# Patient Record
Sex: Male | Born: 1955 | Race: White | Hispanic: No | Marital: Married | Smoking: Former smoker
Health system: Southern US, Community
[De-identification: ages and names within clinical notes are randomized; demographics above are authoritative.]

## PROBLEM LIST (undated history)

## (undated) DIAGNOSIS — G4730 Sleep apnea, unspecified: Secondary | ICD-10-CM

## (undated) DIAGNOSIS — T7840XA Allergy, unspecified, initial encounter: Secondary | ICD-10-CM

## (undated) DIAGNOSIS — M1990 Unspecified osteoarthritis, unspecified site: Secondary | ICD-10-CM

## (undated) DIAGNOSIS — K219 Gastro-esophageal reflux disease without esophagitis: Secondary | ICD-10-CM

## (undated) DIAGNOSIS — T753XXA Motion sickness, initial encounter: Secondary | ICD-10-CM

## (undated) DIAGNOSIS — J45909 Unspecified asthma, uncomplicated: Secondary | ICD-10-CM

## (undated) HISTORY — DX: Unspecified osteoarthritis, unspecified site: M19.90

## (undated) HISTORY — DX: Unspecified asthma, uncomplicated: J45.909

## (undated) HISTORY — DX: Gastro-esophageal reflux disease without esophagitis: K21.9

## (undated) HISTORY — DX: Allergy, unspecified, initial encounter: T78.40XA

## (undated) HISTORY — PX: HERNIA REPAIR: SHX51

## (undated) HISTORY — DX: Sleep apnea, unspecified: G47.30

## (undated) HISTORY — PX: TONSILLECTOMY AND ADENOIDECTOMY: SUR1326

---

## 2006-09-21 ENCOUNTER — Ambulatory Visit: Payer: Self-pay | Admitting: Family Medicine

## 2006-11-22 ENCOUNTER — Ambulatory Visit: Payer: Self-pay | Admitting: Urology

## 2007-03-06 ENCOUNTER — Ambulatory Visit: Payer: Self-pay | Admitting: Gastroenterology

## 2010-07-28 ENCOUNTER — Emergency Department: Payer: Self-pay | Admitting: Emergency Medicine

## 2010-10-27 ENCOUNTER — Ambulatory Visit: Payer: Self-pay | Admitting: Family Medicine

## 2014-10-25 HISTORY — PX: COLONOSCOPY: SHX174

## 2015-04-16 ENCOUNTER — Other Ambulatory Visit: Payer: Self-pay | Admitting: Family Medicine

## 2015-04-16 DIAGNOSIS — F32A Depression, unspecified: Secondary | ICD-10-CM

## 2015-04-16 DIAGNOSIS — F329 Major depressive disorder, single episode, unspecified: Secondary | ICD-10-CM

## 2015-07-06 ENCOUNTER — Telehealth: Payer: Self-pay | Admitting: Gastroenterology

## 2015-07-07 NOTE — Telephone Encounter (Signed)
ERROR

## 2015-07-24 ENCOUNTER — Other Ambulatory Visit: Payer: Self-pay | Admitting: Family Medicine

## 2015-09-15 ENCOUNTER — Telehealth: Payer: Self-pay | Admitting: Gastroenterology

## 2015-09-15 ENCOUNTER — Other Ambulatory Visit: Payer: Self-pay

## 2015-09-15 NOTE — Telephone Encounter (Signed)
Pt has been rescheduled to Feb 20th at [facility] for a screening colonoscopy. Will you check and see if precert if required?

## 2015-09-15 NOTE — Telephone Encounter (Signed)
Patient left a voice message that he needs to reschedule his colonoscopy from 09/29/15 to sometime in February. Due to the snow exams at school are behind. Please call to reschedule

## 2015-09-16 NOTE — Telephone Encounter (Signed)
I do not see any insurance in chart. Is patient self pay?

## 2015-09-16 NOTE — Telephone Encounter (Signed)
Sorry, not sure why its not there.Contacted the pt and new insurance information has been put in system. He has [facility]. Pt now going to [facility] on 11/03/15.

## 2015-10-26 ENCOUNTER — Ambulatory Visit: Admit: 2015-10-26 | Payer: Self-pay | Admitting: Gastroenterology

## 2015-10-26 SURGERY — COLONOSCOPY WITH PROPOFOL
Anesthesia: Choice

## 2015-11-03 LAB — HM COLONOSCOPY

## 2015-11-10 ENCOUNTER — Encounter: Payer: Self-pay | Admitting: Gastroenterology

## 2015-11-26 ENCOUNTER — Other Ambulatory Visit: Payer: Self-pay

## 2015-12-10 ENCOUNTER — Ambulatory Visit
Admission: RE | Admit: 2015-12-10 | Discharge: 2015-12-10 | Disposition: A | Discharge status: Home / Self Care | Payer: BC Managed Care – PPO | Source: Ambulatory Visit | Attending: Family Medicine | Admitting: Family Medicine

## 2015-12-10 ENCOUNTER — Ambulatory Visit (INDEPENDENT_AMBULATORY_CARE_PROVIDER_SITE_OTHER): Payer: BC Managed Care – PPO | Admitting: Family Medicine

## 2015-12-10 ENCOUNTER — Encounter: Payer: Self-pay | Admitting: Family Medicine

## 2015-12-10 VITALS — BP 138/70 | HR 98 | Wt 205.0 lb

## 2015-12-10 DIAGNOSIS — J4522 Mild intermittent asthma with status asthmaticus: Secondary | ICD-10-CM

## 2015-12-10 DIAGNOSIS — R0602 Shortness of breath: Secondary | ICD-10-CM

## 2015-12-10 DIAGNOSIS — R079 Chest pain, unspecified: Secondary | ICD-10-CM

## 2015-12-10 DIAGNOSIS — J40 Bronchitis, not specified as acute or chronic: Secondary | ICD-10-CM | POA: Diagnosis not present

## 2015-12-10 MED ORDER — ALBUTEROL SULFATE (2.5 MG/3ML) 0.083% IN NEBU: 2.5000 mg | INHALATION_SOLUTION | Freq: Once | RESPIRATORY_TRACT | Status: AC

## 2015-12-10 MED ORDER — ALBUTEROL SULFATE HFA 108 (90 BASE) MCG/ACT IN AERS: 2.0000 | INHALATION_SPRAY | Freq: Four times a day (QID) | RESPIRATORY_TRACT | Status: DC | PRN

## 2015-12-10 MED ORDER — PREDNISONE 10 MG PO TABS: 10.0000 mg | ORAL_TABLET | Freq: Every day | ORAL | Status: DC

## 2015-12-10 MED ORDER — AMOXICILLIN-POT CLAVULANATE 875-125 MG PO TABS: 1.0000 | ORAL_TABLET | Freq: Two times a day (BID) | ORAL | Status: DC

## 2015-12-10 NOTE — Progress Notes (Signed)
Name: Eugene Kramer   MRN: [id number]    DOB: 1956/02/15   Date:12/10/2015       Progress Note  Subjective  Chief Complaint  Chief Complaint  Patient presents with  . Shortness of Breath    Shortness of Breath This is a new problem. The current episode started today. The problem occurs daily. The problem has been rapidly worsening. Associated symptoms include sputum production and wheezing. Pertinent negatives include no abdominal pain, chest pain, claudication, coryza, ear pain, fever, headaches, hemoptysis, leg pain, leg swelling, neck pain, orthopnea, PND, rash, rhinorrhea, sore throat, swollen glands, syncope or vomiting. The symptoms are aggravated by pollens and eating (had pound cake with strawberries). He has tried beta agonist inhalers (immediate response to nebulized albuterol) for the symptoms. The treatment provided mild relief. His past medical history is significant for asthma and bronchiolitis. There is no history of allergies.    No problem-specific assessment & plan notes found for this encounter.   No past medical history on file.  Past Surgical History  Procedure Laterality Date  . Hernia repair    . Tonsillectomy and adenoidectomy    . Colonoscopy  10/25/2014    Dr Allen Norris    No family history on file.  Social History   Social History  . Marital Status: Married    Spouse Name: N/A  . Number of Children: N/A  . Years of Education: N/A   Occupational History  . Not on file.   Social History Main Topics  . Smoking status: Former Research scientist (life sciences)  . Smokeless tobacco: Current User    Types: Snuff  . Alcohol Use: 0.0 oz/week    0 Standard drinks or equivalent per week  . Drug Use: No  . Sexual Activity: Not on file   Other Topics Concern  . Not on file   Social History Narrative  . No narrative on file    Allergies  Allergen Reactions  . Erythromycin      Review of Systems  Constitutional: Negative for fever, chills, weight loss and  malaise/fatigue.  HENT: Negative for ear discharge, ear pain, rhinorrhea and sore throat.   Eyes: Negative for blurred vision.  Respiratory: Positive for sputum production, shortness of breath and wheezing. Negative for cough and hemoptysis.   Cardiovascular: Negative for chest pain, palpitations, orthopnea, claudication, leg swelling, syncope and PND.  Gastrointestinal: Negative for heartburn, nausea, vomiting, abdominal pain, diarrhea, constipation, blood in stool and melena.  Genitourinary: Negative for dysuria, urgency, frequency and hematuria.  Musculoskeletal: Negative for myalgias, back pain, joint pain and neck pain.  Skin: Negative for rash.  Neurological: Negative for dizziness, tingling, sensory change, focal weakness and headaches.  Endo/Heme/Allergies: Negative for environmental allergies and polydipsia. Does not bruise/bleed easily.  Psychiatric/Behavioral: Negative for depression and suicidal ideas. The patient is not nervous/anxious and does not have insomnia.      Objective  Filed Vitals:   12/10/15 1348  BP: 138/70  Pulse: 98  Weight: 205 lb (92.987 kg)  SpO2: 99%    Physical Exam  Constitutional: He is oriented to person, place, and time and well-developed, well-nourished, and in no distress.  HENT:  Head: Normocephalic.  Right Ear: External ear normal.  Left Ear: External ear normal.  Nose: Nose normal.  Mouth/Throat: Oropharynx is clear and moist.  Eyes: Conjunctivae and EOM are normal. Pupils are equal, round, and reactive to light. Right eye exhibits no discharge. Left eye exhibits no discharge. No scleral icterus.  Neck: Normal range of motion.  Neck supple. No JVD present. No tracheal deviation present. No thyromegaly present.  Cardiovascular: Normal rate, regular rhythm, normal heart sounds and intact distal pulses.  Exam reveals no gallop and no friction rub.   No murmur heard. Pulmonary/Chest: He is in respiratory distress. He has wheezes. He has no  rales. He exhibits no tenderness.  Abdominal: Soft. Bowel sounds are normal. He exhibits no mass. There is no hepatosplenomegaly. There is no tenderness. There is no rebound, no guarding and no CVA tenderness.  Musculoskeletal: Normal range of motion. He exhibits no edema or tenderness.  Lymphadenopathy:    He has no cervical adenopathy.  Neurological: He is alert and oriented to person, place, and time. He has normal sensation, normal strength, normal reflexes and intact cranial nerves. No cranial nerve deficit.  Skin: Skin is warm. No rash noted.  Psychiatric: Mood and affect normal.  Nursing note and vitals reviewed.     Assessment & Plan  Problem List Items Addressed This Visit    None    Visit Diagnoses    Chest pain, unspecified chest pain type    -  Primary    Relevant Orders    EKG 12-Lead (Completed)    DG Chest 2 View    Asthma, mild intermittent, with status asthmaticus        breo sample/ pt refusea epipen    Relevant Medications    PROAIR HFA 108 (90 Base) MCG/ACT inhaler    predniSONE (DELTASONE) 10 MG tablet    albuterol (PROVENTIL HFA;VENTOLIN HFA) 108 (90 Base) MCG/ACT inhaler    albuterol (PROVENTIL) (2.5 MG/3ML) 0.083% nebulizer solution 2.5 mg    Other Relevant Orders    DG Chest 2 View    Bronchitis        Relevant Medications    amoxicillin-clavulanate (AUGMENTIN) 875-125 MG tablet         Dr. Guido [facility]  12/10/2015

## 2016-09-16 ENCOUNTER — Ambulatory Visit (INDEPENDENT_AMBULATORY_CARE_PROVIDER_SITE_OTHER): Payer: BC Managed Care – PPO | Admitting: Family Medicine

## 2016-09-16 ENCOUNTER — Encounter: Payer: Self-pay | Admitting: Family Medicine

## 2016-09-16 VITALS — BP 100/60 | HR 88 | Wt 206.0 lb

## 2016-09-16 DIAGNOSIS — J45901 Unspecified asthma with (acute) exacerbation: Secondary | ICD-10-CM | POA: Diagnosis not present

## 2016-09-16 DIAGNOSIS — J40 Bronchitis, not specified as acute or chronic: Secondary | ICD-10-CM

## 2016-09-16 DIAGNOSIS — R079 Chest pain, unspecified: Secondary | ICD-10-CM | POA: Diagnosis not present

## 2016-09-16 MED ORDER — PREDNISONE 10 MG PO TABS: 10.0000 mg | ORAL_TABLET | Freq: Every day | ORAL | 0 refills | Status: DC

## 2016-09-16 MED ORDER — IPRATROPIUM-ALBUTEROL 0.5-2.5 (3) MG/3ML IN SOLN: 3.0000 mL | Freq: Four times a day (QID) | RESPIRATORY_TRACT | 3 refills | Status: AC | PRN

## 2016-09-16 MED ORDER — AZITHROMYCIN 250 MG PO TABS: ORAL_TABLET | ORAL | 0 refills | Status: DC

## 2016-09-16 NOTE — Progress Notes (Signed)
Name: Eugene Kramer   MRN: [id number]    DOB: 1956/02/24   Date:09/16/2016       Progress Note  Subjective  Chief Complaint  Chief Complaint  Patient presents with  . Shortness of Breath    has a rescue inhaler- not helping    Shortness of Breath  This is a recurrent problem. The current episode started yesterday. The problem occurs constantly. The problem has been waxing and waning. Associated symptoms include chest pain, orthopnea, PND and wheezing. Pertinent negatives include no abdominal pain, claudication, coryza, ear pain, fever, headaches, hemoptysis, leg pain, leg swelling, neck pain, rash, rhinorrhea, sore throat, sputum production, swollen glands, syncope or vomiting. Nothing aggravates the symptoms. The patient has no known risk factors for DVT/PE. He has tried beta agonist inhalers for the symptoms. The treatment provided mild relief. His past medical history is significant for asthma and pneumonia. There is no history of allergies, aspirin allergies, bronchiolitis, CAD, chronic lung disease, COPD, DVT, a heart failure, PE or a recent surgery.  Chest Pain   This is a new problem. The current episode started yesterday. The problem has been waxing and waning. The quality of the pain is described as tightness. The pain does not radiate. Associated symptoms include orthopnea, PND and shortness of breath. Pertinent negatives include no abdominal pain, back pain, claudication, cough, dizziness, fever, headaches, hemoptysis, leg pain, malaise/fatigue, nausea, palpitations, sputum production, syncope or vomiting.  Pertinent negatives for past medical history include no CAD, no DVT and no PE.    No problem-specific Assessment & Plan notes found for this encounter.   Past Medical History:  Diagnosis Date  . Asthma     Past Surgical History:  Procedure Laterality Date  . COLONOSCOPY  10/25/2014   Dr Allen Norris  . HERNIA REPAIR    . TONSILLECTOMY AND ADENOIDECTOMY      History  reviewed. No pertinent family history.  Social History   Social History  . Marital status: Married    Spouse name: N/A  . Number of children: N/A  . Years of education: N/A   Occupational History  . Not on file.   Social History Main Topics  . Smoking status: Former Research scientist (life sciences)  . Smokeless tobacco: Current User    Types: Snuff  . Alcohol use 0.0 oz/week  . Drug use: No  . Sexual activity: Yes   Other Topics Concern  . Not on file   Social History Narrative  . No narrative on file    Allergies  Allergen Reactions  . Erythromycin      Review of Systems  Constitutional: Negative for chills, fever, malaise/fatigue and weight loss.  HENT: Negative for ear discharge, ear pain, rhinorrhea and sore throat.   Eyes: Negative for blurred vision.  Respiratory: Positive for shortness of breath and wheezing. Negative for cough, hemoptysis and sputum production.   Cardiovascular: Positive for chest pain, orthopnea and PND. Negative for palpitations, claudication, leg swelling and syncope.  Gastrointestinal: Negative for abdominal pain, blood in stool, constipation, diarrhea, heartburn, melena, nausea and vomiting.  Genitourinary: Negative for dysuria, frequency, hematuria and urgency.  Musculoskeletal: Negative for back pain, joint pain, myalgias and neck pain.  Skin: Negative for rash.  Neurological: Negative for dizziness, tingling, sensory change, focal weakness and headaches.  Endo/Heme/Allergies: Negative for environmental allergies and polydipsia. Does not bruise/bleed easily.  Psychiatric/Behavioral: Negative for depression and suicidal ideas. The patient is not nervous/anxious and does not have insomnia.      Objective  Vitals:  09/16/16 0833  BP: 100/60  Pulse: 88  SpO2: 98%  Weight: 206 lb (93.4 kg)    Physical Exam  Constitutional: He is oriented to person, place, and time and well-developed, well-nourished, and in no distress.  HENT:  Head: Normocephalic.   Right Ear: External ear normal.  Left Ear: External ear normal.  Nose: Nose normal.  Mouth/Throat: Oropharynx is clear and moist.  Eyes: Conjunctivae and EOM are normal. Pupils are equal, round, and reactive to light. Right eye exhibits no discharge. Left eye exhibits no discharge. No scleral icterus.  Neck: Normal range of motion. Neck supple. No JVD present. No tracheal deviation present. No thyromegaly present.  Cardiovascular: Normal rate, regular rhythm, normal heart sounds and intact distal pulses.  Exam reveals no gallop and no friction rub.   No murmur heard. Pulmonary/Chest: Effort normal. No respiratory distress. He has wheezes. He has no rales. He exhibits no tenderness.  Abdominal: Soft. Bowel sounds are normal. He exhibits no distension and no mass. There is no hepatosplenomegaly. There is no tenderness. There is no rebound, no guarding and no CVA tenderness.  Musculoskeletal: Normal range of motion. He exhibits no edema or tenderness.  Lymphadenopathy:    He has no cervical adenopathy.  Neurological: He is alert and oriented to person, place, and time. He has normal sensation, normal strength, normal reflexes and intact cranial nerves. No cranial nerve deficit.  Skin: Skin is warm. No rash noted.  Psychiatric: Mood and affect normal.  Nursing note and vitals reviewed.     Assessment & Plan  Problem List Items Addressed This Visit    None    Visit Diagnoses    Moderate asthma with acute exacerbation, unspecified whether persistent    -  Primary   spacer/ nebulizer with tubing/pt to inquire about [facility] pulmonologist   Relevant Medications   ipratropium-albuterol (DUONEB) 0.5-2.5 (3) MG/3ML SOLN   predniSONE (DELTASONE) 10 MG tablet   Other Relevant Orders   Ambulatory referral to Pulmonology   Chest pain, unspecified type       Relevant Orders   EKG 12-Lead (Completed)   Bronchitis       Relevant Orders   Ambulatory referral to Pulmonology        Dr. Otilio Miu [facility]  09/16/16

## 2016-09-28 ENCOUNTER — Ambulatory Visit (INDEPENDENT_AMBULATORY_CARE_PROVIDER_SITE_OTHER): Payer: BC Managed Care – PPO | Admitting: Family Medicine

## 2016-09-28 ENCOUNTER — Encounter: Payer: Self-pay | Admitting: Family Medicine

## 2016-09-28 ENCOUNTER — Other Ambulatory Visit: Payer: Self-pay

## 2016-09-28 VITALS — BP 130/70 | HR 80 | Ht 72.0 in | Wt 207.0 lb

## 2016-09-28 DIAGNOSIS — N410 Acute prostatitis: Secondary | ICD-10-CM

## 2016-09-28 DIAGNOSIS — J40 Bronchitis, not specified as acute or chronic: Secondary | ICD-10-CM | POA: Diagnosis not present

## 2016-09-28 LAB — POCT URINALYSIS DIPSTICK
Bilirubin, UA: NEGATIVE
Blood, UA: NEGATIVE
Glucose, UA: NEGATIVE
KETONES UA: NEGATIVE
Leukocytes, UA: NEGATIVE
Nitrite, UA: NEGATIVE
PH UA: 5
PROTEIN UA: NEGATIVE
SPEC GRAV UA: 1.01
Urobilinogen, UA: 0.2

## 2016-09-28 MED ORDER — LEVOFLOXACIN 500 MG PO TABS: 500.0000 mg | ORAL_TABLET | Freq: Every day | ORAL | 0 refills | Status: DC

## 2016-09-28 NOTE — Progress Notes (Signed)
Name: Eugene Kramer   MRN: [id number]    DOB: 13-Dec-1955   Date:09/28/2016       Progress Note  Subjective  Chief Complaint  Chief Complaint  Patient presents with  . Bronchitis    has started with fever, chills, hip aching, sweating, coughing up yellow production  . inhaler    has one more day of Breo sample- do you want him to stay on it?    Cough  This is a chronic problem. The problem has been waxing and waning. The cough is productive of purulent sputum. Associated symptoms include chills, a fever, nasal congestion, postnasal drip, rhinorrhea and a sore throat. Pertinent negatives include no chest pain, ear pain, headaches, heartburn, hemoptysis, myalgias, rash, shortness of breath, weight loss or wheezing. He has tried a beta-agonist inhaler for the symptoms. The treatment provided mild relief. There is no history of environmental allergies.    No problem-specific Assessment & Plan notes found for this encounter.   Past Medical History:  Diagnosis Date  . Asthma     Past Surgical History:  Procedure Laterality Date  . COLONOSCOPY  10/25/2014   Dr Allen Norris  . HERNIA REPAIR    . TONSILLECTOMY AND ADENOIDECTOMY      No family history on file.  Social History   Social History  . Marital status: Married    Spouse name: N/A  . Number of children: N/A  . Years of education: N/A   Occupational History  . Not on file.   Social History Main Topics  . Smoking status: Former Research scientist (life sciences)  . Smokeless tobacco: Current User    Types: Snuff  . Alcohol use 0.0 oz/week  . Drug use: No  . Sexual activity: Yes   Other Topics Concern  . Not on file   Social History Narrative  . No narrative on file    Allergies  Allergen Reactions  . Erythromycin      Review of Systems  Constitutional: Positive for chills and fever. Negative for malaise/fatigue and weight loss.  HENT: Positive for postnasal drip, rhinorrhea and sore throat. Negative for ear discharge and ear pain.    Eyes: Negative for blurred vision.  Respiratory: Positive for cough. Negative for hemoptysis, sputum production, shortness of breath and wheezing.   Cardiovascular: Negative for chest pain, palpitations and leg swelling.  Gastrointestinal: Negative for abdominal pain, blood in stool, constipation, diarrhea, heartburn, melena and nausea.  Genitourinary: Negative for dysuria, frequency, hematuria and urgency.  Musculoskeletal: Negative for back pain, joint pain, myalgias and neck pain.  Skin: Negative for rash.  Neurological: Negative for dizziness, tingling, sensory change, focal weakness and headaches.  Endo/Heme/Allergies: Negative for environmental allergies and polydipsia. Does not bruise/bleed easily.  Psychiatric/Behavioral: Negative for depression and suicidal ideas. The patient is not nervous/anxious and does not have insomnia.      Objective  Vitals:   09/28/16 1217  BP: 130/70  Pulse: 80  Weight: 207 lb (93.9 kg)  Height: 6' (1.829 m)    Physical Exam  Constitutional: He is oriented to person, place, and time and well-developed, well-nourished, and in no distress.  HENT:  Head: Normocephalic.  Right Ear: External ear normal.  Left Ear: External ear normal.  Nose: Nose normal.  Mouth/Throat: Oropharynx is clear and moist.  Eyes: Conjunctivae and EOM are normal. Pupils are equal, round, and reactive to light. Right eye exhibits no discharge. Left eye exhibits no discharge. No scleral icterus.  Neck: Normal range of motion. Neck supple. No  JVD present. No tracheal deviation present. No thyromegaly present.  Cardiovascular: Normal rate, regular rhythm, normal heart sounds and intact distal pulses.  Exam reveals no gallop and no friction rub.   No murmur heard. Pulmonary/Chest: Breath sounds normal. No respiratory distress. He has no wheezes. He has no rales.  Abdominal: Soft. Bowel sounds are normal. He exhibits no mass. There is no hepatosplenomegaly. There is no  tenderness. There is no rebound, no guarding and no CVA tenderness.  Musculoskeletal: Normal range of motion. He exhibits no edema or tenderness.  Lymphadenopathy:    He has no cervical adenopathy.  Neurological: He is alert and oriented to person, place, and time. He has normal sensation, normal strength, normal reflexes and intact cranial nerves. No cranial nerve deficit.  Skin: Skin is warm. No rash noted.  Psychiatric: Mood and affect normal.  Nursing note and vitals reviewed.     Assessment & Plan  Problem List Items Addressed This Visit    None    Visit Diagnoses    Bronchitis    -  Primary   Breo sample given   Relevant Medications   levofloxacin (LEVAQUIN) 500 MG tablet   Acute prostatitis       Relevant Medications   levofloxacin (LEVAQUIN) 500 MG tablet        Dr. [facility]  09/28/16

## 2017-04-06 ENCOUNTER — Encounter: Payer: Self-pay | Admitting: Family Medicine

## 2017-04-06 ENCOUNTER — Ambulatory Visit (INDEPENDENT_AMBULATORY_CARE_PROVIDER_SITE_OTHER): Payer: BC Managed Care – PPO | Admitting: Family Medicine

## 2017-04-06 ENCOUNTER — Encounter: Payer: BC Managed Care – PPO | Admitting: Family Medicine

## 2017-04-06 VITALS — BP 120/72 | HR 78 | Ht 72.0 in | Wt 206.0 lb

## 2017-04-06 DIAGNOSIS — R351 Nocturia: Secondary | ICD-10-CM | POA: Diagnosis not present

## 2017-04-06 DIAGNOSIS — Z0000 Encounter for general adult medical examination without abnormal findings: Secondary | ICD-10-CM

## 2017-04-06 MED ORDER — ALBUTEROL SULFATE HFA 108 (90 BASE) MCG/ACT IN AERS: 2.0000 | INHALATION_SPRAY | Freq: Four times a day (QID) | RESPIRATORY_TRACT | 11 refills | Status: DC | PRN

## 2017-04-06 MED ORDER — FLUTICASONE PROPIONATE 50 MCG/ACT NA SUSP: 1.0000 | Freq: Every day | NASAL | 11 refills | Status: DC

## 2017-04-06 NOTE — Progress Notes (Signed)
Name: Eugene Kramer   MRN: [id number]    DOB: [birth year]   Date:04/06/2017       Progress Note  Subjective  Chief Complaint  Chief Complaint  Patient presents with  . Annual Exam  . Allergic Rhinitis     Patient presents for annual physical exam.   Sinus Problem  This is a chronic problem. The current episode started more than 1 year ago. The problem has been waxing and waning since onset. There has been no fever. The pain is mild. Associated symptoms include congestion and sinus pressure. Pertinent negatives include no chills, coughing, diaphoresis, ear pain, headaches, hoarse voice, neck pain, shortness of breath, sneezing, sore throat or swollen glands. Treatments tried: antihistamine. The treatment provided mild relief.    No problem-specific Assessment & Plan notes found for this encounter.   Past Medical History:  Diagnosis Date  . Asthma     Past Surgical History:  Procedure Laterality Date  . COLONOSCOPY  10/25/2014   Dr Allen Norris  . HERNIA REPAIR    . TONSILLECTOMY AND ADENOIDECTOMY      No family history on file.  Social History   Social History  . Marital status: Married    Spouse name: N/A  . Number of children: N/A  . Years of education: N/A   Occupational History  . Not on file.   Social History Main Topics  . Smoking status: Former Research scientist (life sciences)  . Smokeless tobacco: Current User    Types: Snuff  . Alcohol use 0.0 oz/week  . Drug use: No  . Sexual activity: Yes   Other Topics Concern  . Not on file   Social History Narrative  . No narrative on file    Allergies  Allergen Reactions  . Erythromycin     Outpatient Medications Prior to Visit  Medication Sig Dispense Refill  . APPLE CIDER VINEGAR PO Take 1 ampule by mouth daily.    Marland Kitchen aspirin 81 MG tablet Take 81 mg by mouth daily.    . Cinnamon 500 MG TABS Take 1 tablet by mouth daily.    . Garlic 629 MG CAPS Take 1 capsule by mouth daily.    Marland Kitchen ipratropium-albuterol (DUONEB) 0.5-2.5 (3)  MG/3ML SOLN Take 3 mLs by nebulization every 6 (six) hours as needed. 360 mL 3  . Misc Natural Products (PROSTATE HEALTH) CAPS Take 1 capsule by mouth daily.    . Omega-3 Fatty Acids (FISH OIL) 1000 MG CAPS Take 1 capsule by mouth daily.    Marland Kitchen albuterol (PROVENTIL HFA;VENTOLIN HFA) 108 (90 Base) MCG/ACT inhaler Inhale 2 puffs into the lungs every 6 (six) hours as needed for wheezing or shortness of breath. 1 Inhaler 6  . fluticasone (FLONASE) 50 MCG/ACT nasal spray     . levofloxacin (LEVAQUIN) 500 MG tablet Take 1 tablet (500 mg total) by mouth daily. 14 tablet 0  . predniSONE (DELTASONE) 10 MG tablet Take 1 tablet (10 mg total) by mouth daily with breakfast. 30 tablet 0   Facility-Administered Medications Prior to Visit  Medication Dose Route Frequency Provider Last Rate Last Dose  . albuterol (PROVENTIL) (2.5 MG/3ML) 0.083% nebulizer solution 2.5 mg  2.5 mg Nebulization Once Juline Patch, MD        Review of Systems  Constitutional: Negative for chills, diaphoresis, fever, malaise/fatigue and weight loss.  HENT: Positive for congestion, hearing loss, sinus pain and sinus pressure. Negative for ear discharge, ear pain, hoarse voice, sneezing and sore throat.   Eyes: Negative for  blurred vision.  Respiratory: Negative for cough, sputum production, shortness of breath and wheezing.   Cardiovascular: Negative for chest pain, palpitations and leg swelling.  Gastrointestinal: Negative for abdominal pain, blood in stool, constipation, diarrhea, heartburn, melena and nausea.  Genitourinary: Negative for dysuria, frequency, hematuria and urgency.       Nocturia  Musculoskeletal: Negative for back pain, joint pain, myalgias and neck pain.  Skin: Negative for rash.  Neurological: Negative for dizziness, tingling, sensory change, focal weakness and headaches.  Endo/Heme/Allergies: Negative for environmental allergies and polydipsia. Does not bruise/bleed easily.  Psychiatric/Behavioral: Negative  for depression and suicidal ideas. The patient is not nervous/anxious and does not have insomnia.      Objective  Vitals:   04/06/17 0940  BP: 120/72  Pulse: 78  Weight: 206 lb (93.4 kg)  Height: 6' (1.829 m)    Physical Exam  Constitutional: He is oriented to person, place, and time and well-developed, well-nourished, and in no distress.  HENT:  Head: Normocephalic.  Right Ear: Tympanic membrane, external ear and ear canal normal.  Left Ear: Tympanic membrane, external ear and ear canal normal.  Nose: Nose normal.  Mouth/Throat: Uvula is midline, oropharynx is clear and moist and mucous membranes are normal.  Eyes: Pupils are equal, round, and reactive to light. Conjunctivae and EOM are normal. Right eye exhibits no discharge. Left eye exhibits no discharge. No scleral icterus.  Fundoscopic exam:      The right eye shows no arteriolar narrowing and no AV nicking.       The left eye shows no arteriolar narrowing and no AV nicking.  Neck: Trachea normal, normal range of motion and phonation normal. Neck supple. Normal carotid pulses, no hepatojugular reflux and no JVD present. No tracheal tenderness present. Carotid bruit is not present. No tracheal deviation present. No thyroid mass and no thyromegaly present.  Cardiovascular: Normal rate, regular rhythm, S1 normal, S2 normal, normal heart sounds, intact distal pulses and normal pulses.  PMI is not displaced.  Exam reveals no gallop, no S3, no S4 and no friction rub.   No murmur heard. Pulmonary/Chest: Breath sounds normal. No stridor. No respiratory distress. He has no wheezes. He has no rales. Right breast exhibits no mass. Left breast exhibits no mass.  Abdominal: Soft. Normal aorta and bowel sounds are normal. He exhibits no mass. There is no hepatosplenomegaly. There is no tenderness. There is no rebound, no guarding and no CVA tenderness.  Genitourinary: Testes/scrotum normal.  Musculoskeletal: Normal range of motion. He  exhibits no edema or tenderness.  Lymphadenopathy:       Head (right side): No submental and no submandibular adenopathy present.       Head (left side): No submental and no submandibular adenopathy present.    He has no cervical adenopathy.  Neurological: He is alert and oriented to person, place, and time. He has normal sensation, normal strength, normal reflexes and intact cranial nerves. No cranial nerve deficit.  Skin: Skin is warm and intact. No rash noted.  Psychiatric: Mood and affect normal.  Nursing note and vitals reviewed.     Assessment & Plan  Problem List Items Addressed This Visit    None    Visit Diagnoses    Annual physical exam    -  Primary   Relevant Orders   Lipid Profile   Renal function panel   PSA   Nocturia       Relevant Orders   Renal function panel   PSA  Meds ordered this encounter  Medications  . albuterol (PROVENTIL HFA;VENTOLIN HFA) 108 (90 Base) MCG/ACT inhaler    Sig: Inhale 2 puffs into the lungs every 6 (six) hours as needed for wheezing or shortness of breath.    Dispense:  1 Inhaler    Refill:  11  . fluticasone (FLONASE) 50 MCG/ACT nasal spray    Sig: Place 1 spray into both nostrils daily.    Dispense:  16 g    Refill:  11      Dr. [facility]  04/06/17

## 2017-04-07 LAB — LIPID PANEL
CHOLESTEROL TOTAL: 193 mg/dL (ref 100–199)
Chol/HDL Ratio: 4.8 ratio (ref 0.0–5.0)
HDL: 40 mg/dL (ref >39)
LDL Calculated: 124 mg/dL — ABNORMAL HIGH (ref 0–99)
Triglycerides: 147 mg/dL (ref 0–149)
VLDL CHOLESTEROL CAL: 29 mg/dL (ref 5–40)

## 2017-04-07 LAB — RENAL FUNCTION PANEL
ALBUMIN: 4.6 g/dL (ref 3.5–5.5)
BUN/Creatinine Ratio: 12 (ref 9–20)
BUN: 11 mg/dL (ref 6–24)
CHLORIDE: 102 mmol/L (ref 96–106)
CO2: 25 mmol/L (ref 20–29)
Calcium: 9.6 mg/dL (ref 8.7–10.2)
Creatinine, Ser: 0.95 mg/dL (ref 0.76–1.27)
GFR calc non Af Amer: 87 mL/min/{1.73_m2} (ref >59)
GFR, EST AFRICAN AMERICAN: 101 mL/min/{1.73_m2} (ref >59)
Glucose: 78 mg/dL (ref 65–99)
Phosphorus: 3.1 mg/dL (ref 2.5–4.5)
Potassium: 4.3 mmol/L (ref 3.5–5.2)
Sodium: 143 mmol/L (ref 134–144)

## 2017-04-07 LAB — PSA: Prostate Specific Ag, Serum: 0.8 ng/mL (ref 0.0–4.0)

## 2017-04-10 ENCOUNTER — Other Ambulatory Visit: Payer: Self-pay

## 2017-06-02 ENCOUNTER — Other Ambulatory Visit: Payer: Self-pay

## 2017-06-26 ENCOUNTER — Encounter: Payer: Self-pay | Admitting: Family Medicine

## 2017-06-26 ENCOUNTER — Ambulatory Visit
Admission: RE | Admit: 2017-06-26 | Discharge: 2017-06-26 | Disposition: A | Discharge status: Home / Self Care | Payer: BC Managed Care – PPO | Source: Ambulatory Visit | Attending: Family Medicine | Admitting: Family Medicine

## 2017-06-26 ENCOUNTER — Other Ambulatory Visit: Payer: Self-pay | Admitting: Family Medicine

## 2017-06-26 ENCOUNTER — Ambulatory Visit (INDEPENDENT_AMBULATORY_CARE_PROVIDER_SITE_OTHER): Payer: BC Managed Care – PPO | Admitting: Family Medicine

## 2017-06-26 VITALS — BP 110/78 | HR 88 | Ht 72.0 in | Wt 212.0 lb

## 2017-06-26 DIAGNOSIS — J4521 Mild intermittent asthma with (acute) exacerbation: Secondary | ICD-10-CM

## 2017-06-26 DIAGNOSIS — J40 Bronchitis, not specified as acute or chronic: Secondary | ICD-10-CM

## 2017-06-26 DIAGNOSIS — R918 Other nonspecific abnormal finding of lung field: Secondary | ICD-10-CM | POA: Insufficient documentation

## 2017-06-26 DIAGNOSIS — J45909 Unspecified asthma, uncomplicated: Secondary | ICD-10-CM | POA: Insufficient documentation

## 2017-06-26 DIAGNOSIS — R05 Cough: Secondary | ICD-10-CM | POA: Diagnosis not present

## 2017-06-26 MED ORDER — PREDNISONE 10 MG (21) PO TBPK: ORAL_TABLET | Freq: Every day | ORAL | 0 refills | Status: DC

## 2017-06-26 MED ORDER — AMOXICILLIN-POT CLAVULANATE 875-125 MG PO TABS: 1.0000 | ORAL_TABLET | Freq: Two times a day (BID) | ORAL | 0 refills | Status: DC

## 2017-06-26 NOTE — Progress Notes (Signed)
Name: Eugene Kramer   MRN: [id number]    DOB: [birth year]   Date:06/26/2017       Progress Note  Subjective  Chief Complaint  Chief Complaint  Patient presents with  . Asthma    has been having to take ventolin more than prescribed. Has been mowing and tilling. Tad prednisone at home- took 30mg  on Sat and 30mg  yesterday    Asthma  He complains of cough, difficulty breathing, hoarse voice, shortness of breath and wheezing. There is no chest tightness, frequent throat clearing, hemoptysis or sputum production. This is a recurrent problem. The current episode started in the past 7 days. The problem occurs intermittently. The problem has been waxing and waning. The cough is productive of purulent sputum. Associated symptoms include dyspnea on exertion, nasal congestion, rhinorrhea and sneezing. Pertinent negatives include no appetite change, chest pain, ear congestion, ear pain, fever, headaches, heartburn, malaise/fatigue, myalgias, orthopnea, PND, postnasal drip, sore throat, sweats, trouble swallowing or weight loss. His symptoms are aggravated by change in weather and occupational exposure. His symptoms are alleviated by beta-agonist and oral steroids. He reports moderate improvement on treatment. Risk factors for lung disease include occupational exposure. His past medical history is significant for asthma.    No problem-specific Assessment & Plan notes found for this encounter.   Past Medical History:  Diagnosis Date  . Asthma     Past Surgical History:  Procedure Laterality Date  . COLONOSCOPY  10/25/2014   Dr Allen Norris  . HERNIA REPAIR    . TONSILLECTOMY AND ADENOIDECTOMY      No family history on file.  Social History   Social History  . Marital status: Married    Spouse name: N/A  . Number of children: N/A  . Years of education: N/A   Occupational History  . Not on file.   Social History Main Topics  . Smoking status: Former Research scientist (life sciences)  . Smokeless tobacco: Current  User    Types: Snuff  . Alcohol use 0.0 oz/week  . Drug use: No  . Sexual activity: Yes   Other Topics Concern  . Not on file   Social History Narrative  . No narrative on file    Allergies  Allergen Reactions  . Erythromycin     Outpatient Medications Prior to Visit  Medication Sig Dispense Refill  . albuterol (PROVENTIL HFA;VENTOLIN HFA) 108 (90 Base) MCG/ACT inhaler Inhale 2 puffs into the lungs every 6 (six) hours as needed for wheezing or shortness of breath. 1 Inhaler 11  . APPLE CIDER VINEGAR PO Take 1 ampule by mouth daily.    Marland Kitchen aspirin 81 MG tablet Take 81 mg by mouth daily.    . Cinnamon 500 MG TABS Take 1 tablet by mouth daily.    . fluticasone (FLONASE) 50 MCG/ACT nasal spray Place 1 spray into both nostrils daily. 16 g 11  . Garlic 893 MG CAPS Take 1 capsule by mouth daily.    Marland Kitchen ipratropium-albuterol (DUONEB) 0.5-2.5 (3) MG/3ML SOLN Take 3 mLs by nebulization every 6 (six) hours as needed. 360 mL 3  . Misc Natural Products (PROSTATE HEALTH) CAPS Take 1 capsule by mouth daily.    . Omega-3 Fatty Acids (FISH OIL) 1000 MG CAPS Take 1 capsule by mouth daily.     Facility-Administered Medications Prior to Visit  Medication Dose Route Frequency Provider Last Rate Last Dose  . albuterol (PROVENTIL) (2.5 MG/3ML) 0.083% nebulizer solution 2.5 mg  2.5 mg Nebulization Once Juline Patch, MD  Review of Systems  Constitutional: Negative for appetite change, chills, fever, malaise/fatigue and weight loss.  HENT: Positive for hoarse voice, rhinorrhea and sneezing. Negative for ear discharge, ear pain, postnasal drip, sore throat and trouble swallowing.   Eyes: Negative for blurred vision.  Respiratory: Positive for cough, shortness of breath and wheezing. Negative for hemoptysis and sputum production.   Cardiovascular: Positive for dyspnea on exertion. Negative for chest pain, palpitations, leg swelling and PND.  Gastrointestinal: Negative for abdominal pain, blood in  stool, constipation, diarrhea, heartburn, melena and nausea.  Genitourinary: Negative for dysuria, frequency, hematuria and urgency.  Musculoskeletal: Negative for back pain, joint pain, myalgias and neck pain.  Skin: Negative for rash.  Neurological: Negative for dizziness, tingling, sensory change, focal weakness and headaches.  Endo/Heme/Allergies: Negative for environmental allergies and polydipsia. Does not bruise/bleed easily.  Psychiatric/Behavioral: Negative for depression and suicidal ideas. The patient is not nervous/anxious and does not have insomnia.      Objective  Vitals:   06/26/17 1407  BP: 110/78  Pulse: 88  SpO2: 97%  Weight: 212 lb (96.2 kg)  Height: 6' (1.829 m)    Physical Exam  Constitutional: He is oriented to person, place, and time and well-developed, well-nourished, and in no distress.  HENT:  Head: Normocephalic.  Right Ear: External ear normal.  Left Ear: External ear normal.  Nose: Nose normal.  Mouth/Throat: Oropharynx is clear and moist.  Eyes: Pupils are equal, round, and reactive to light. Conjunctivae and EOM are normal. Right eye exhibits no discharge. Left eye exhibits no discharge. No scleral icterus.  Neck: Normal range of motion. Neck supple. No JVD present. No tracheal deviation present. No thyromegaly present.  Cardiovascular: Normal rate, regular rhythm, normal heart sounds and intact distal pulses.  Exam reveals no gallop and no friction rub.   No murmur heard. Pulmonary/Chest: No respiratory distress. He has wheezes. He has no rales.  Abdominal: Soft. Bowel sounds are normal. He exhibits no mass. There is no hepatosplenomegaly. There is no tenderness. There is no rebound, no guarding and no CVA tenderness.  Musculoskeletal: Normal range of motion. He exhibits no edema or tenderness.  Lymphadenopathy:    He has no cervical adenopathy.  Neurological: He is alert and oriented to person, place, and time. He has normal sensation, normal  strength, normal reflexes and intact cranial nerves. No cranial nerve deficit.  Skin: Skin is warm. No rash noted.  Psychiatric: Mood and affect normal.  Nursing note and vitals reviewed.     Assessment & Plan  Problem List Items Addressed This Visit    None      No orders of the defined types were placed in this encounter.     Dr. [facility]  06/26/17

## 2017-06-26 NOTE — Patient Instructions (Signed)

## 2017-07-20 ENCOUNTER — Other Ambulatory Visit: Payer: Self-pay

## 2017-08-05 IMAGING — CR DG CHEST 2V
3 series · 3 of 3 positions shown · non-contrast
Comparison: 09/21/2006

CLINICAL DATA: Chest pain.  Wheezing.  Ex-smoker.

EXAM:
CHEST  2 VIEW

[chest pa (1 of 2)]
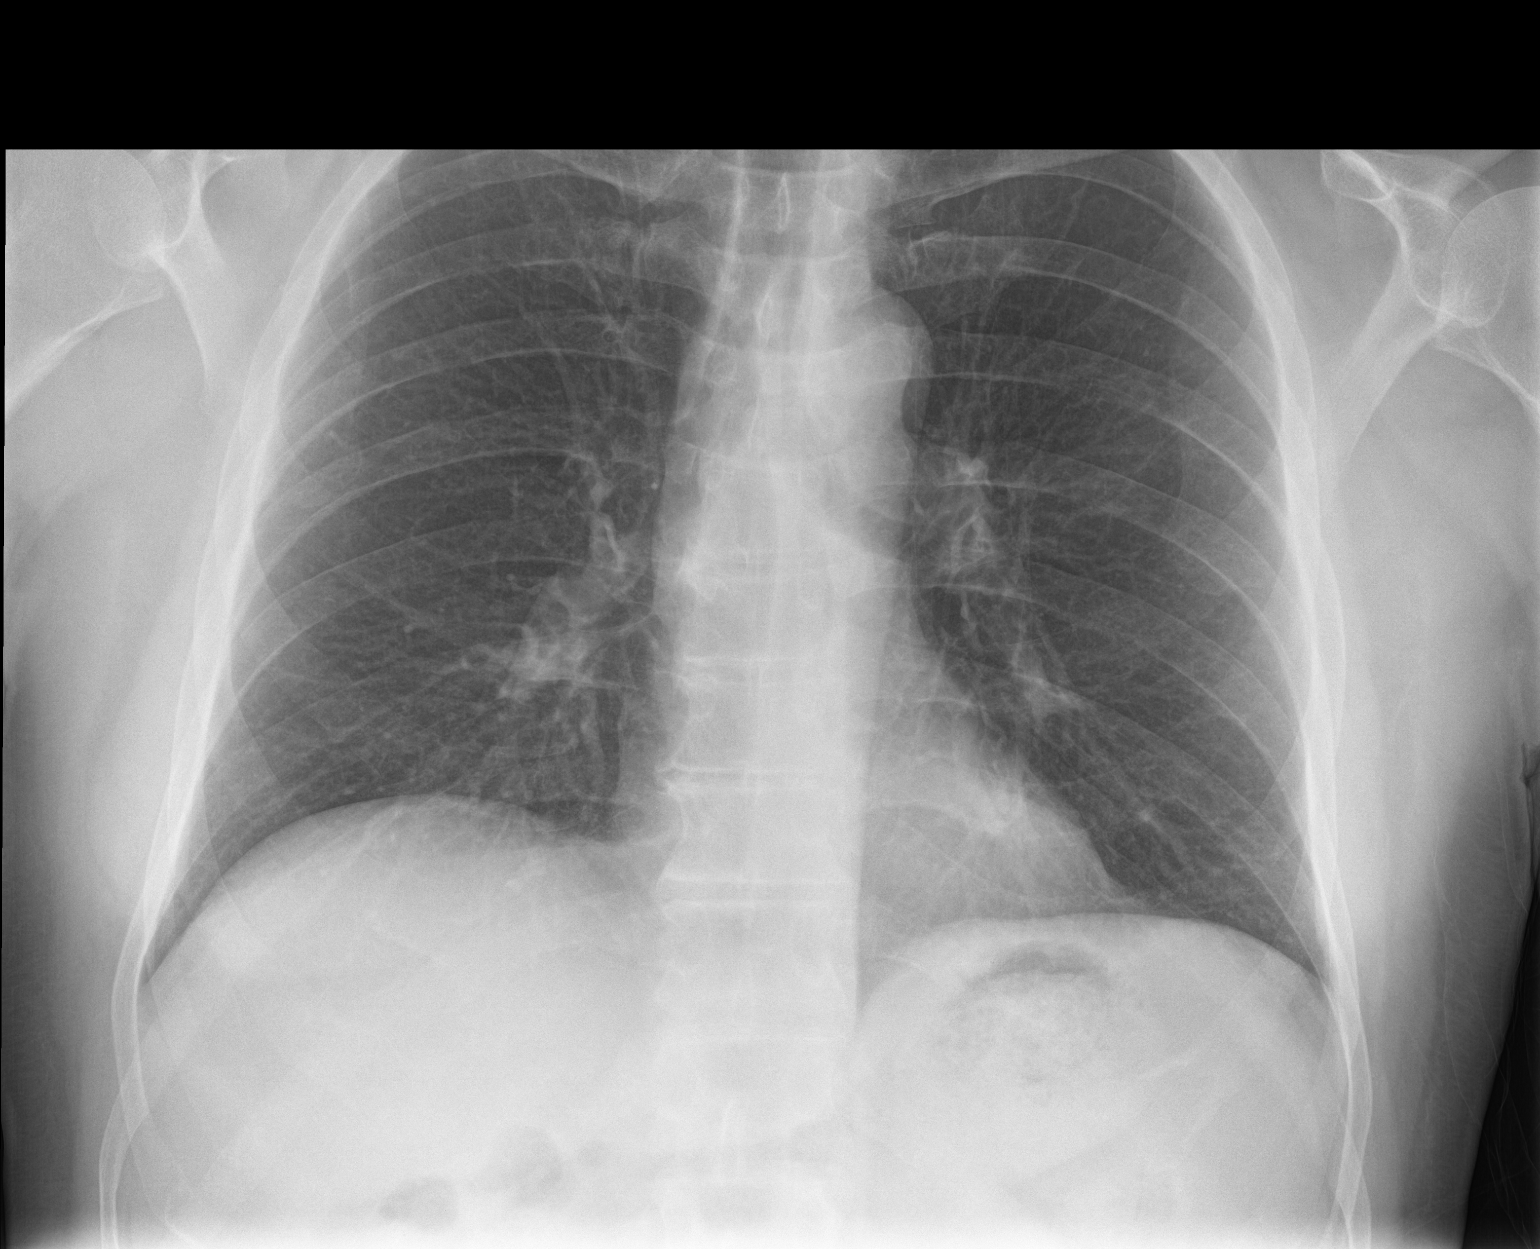

[chest lat]
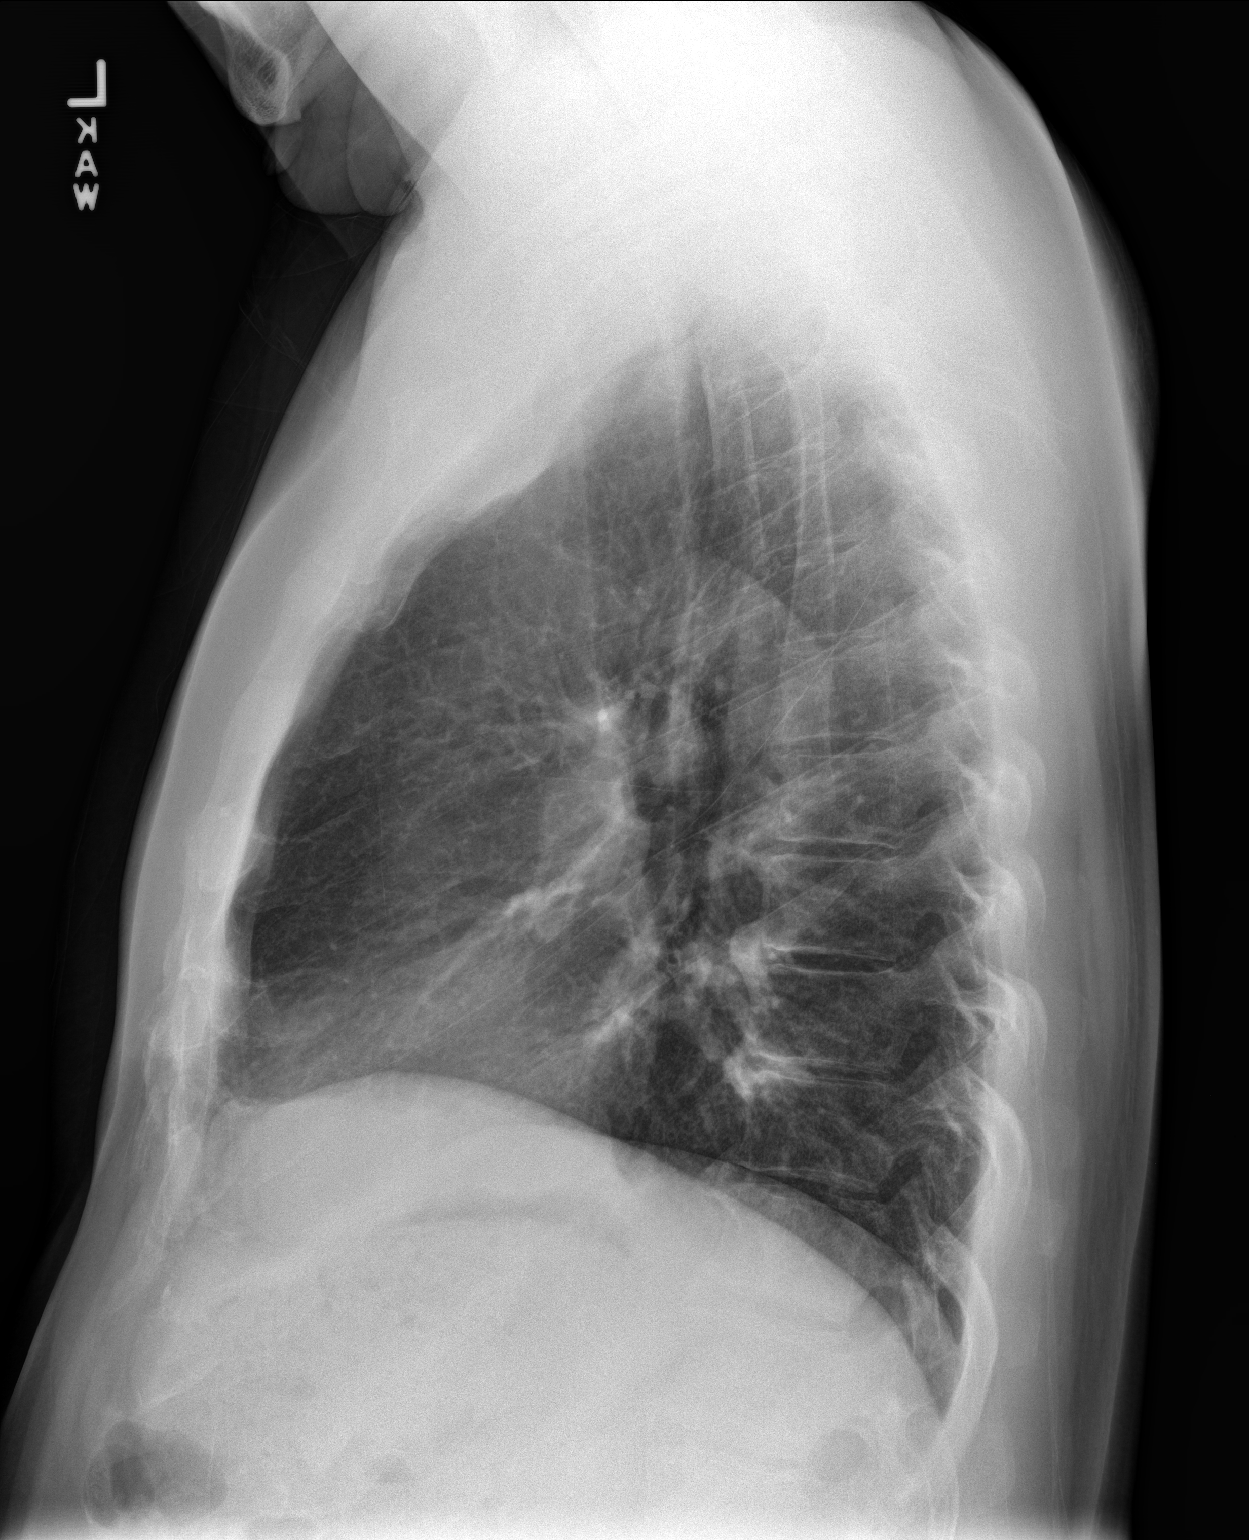

[chest pa (2 of 2)]
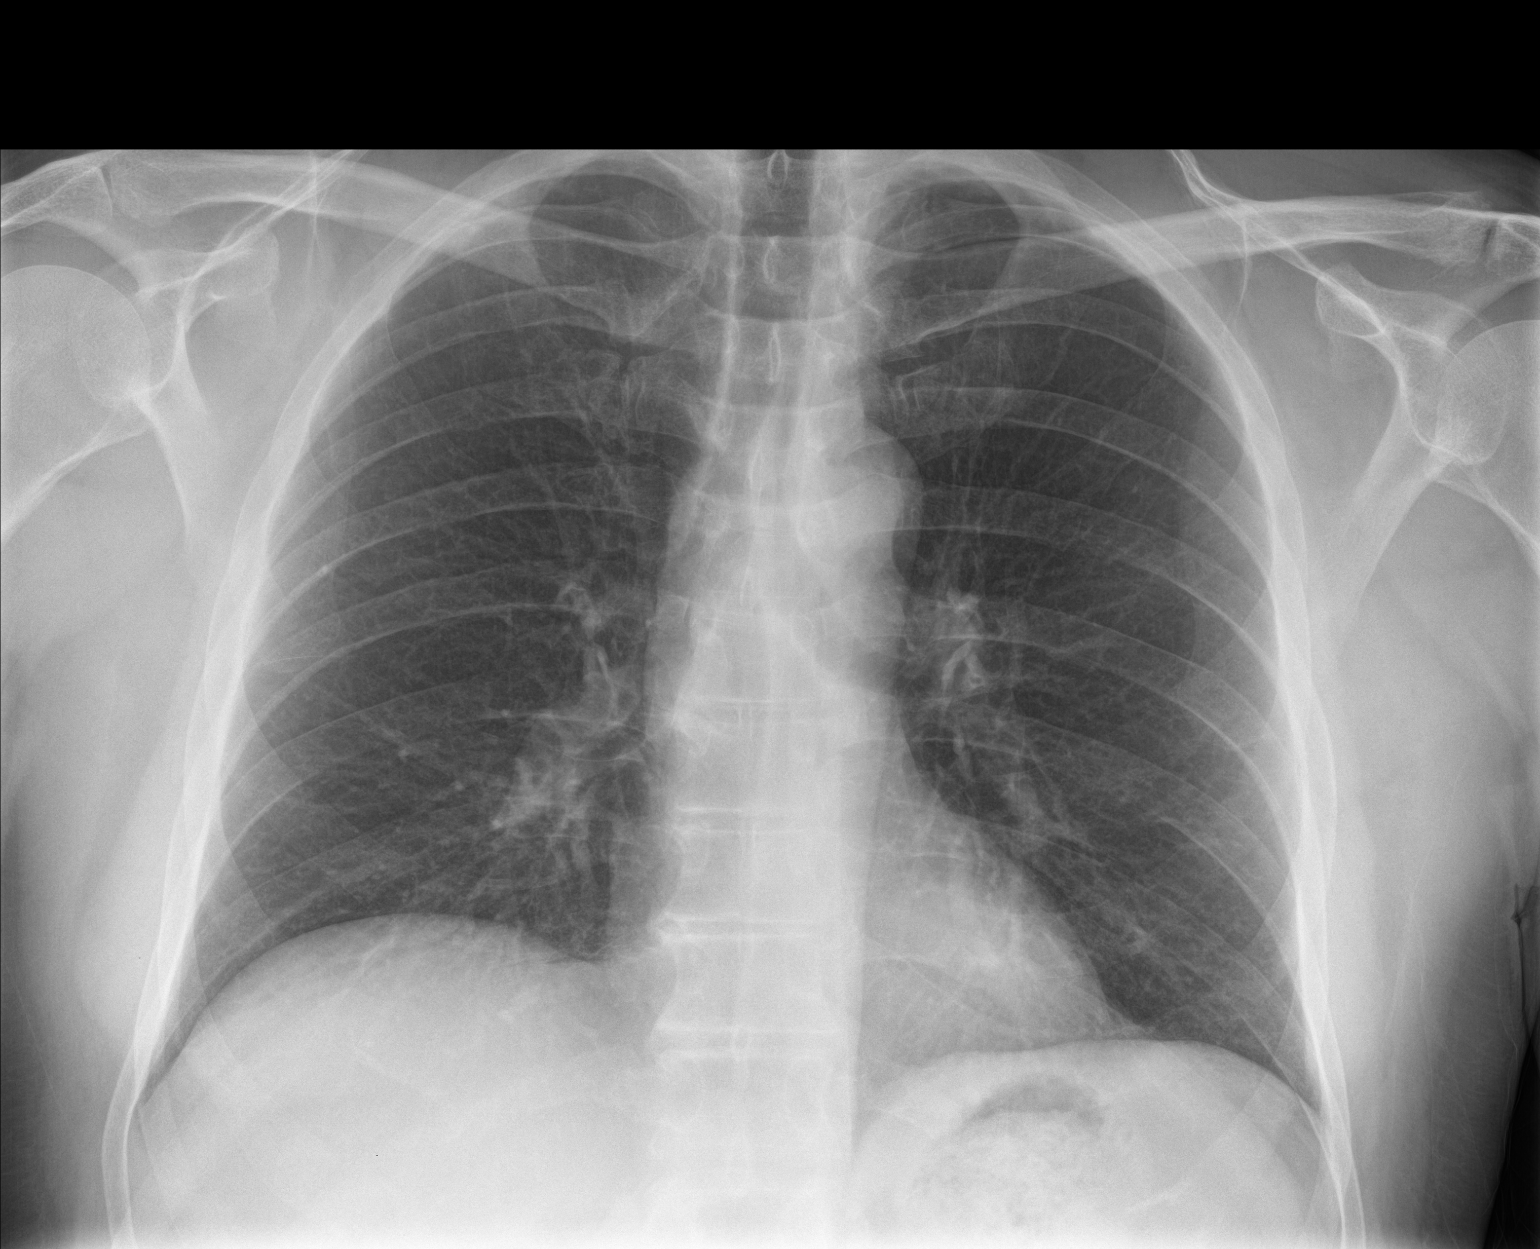

[3 of 3 positions shown; findings below may reference images not displayed]

FINDINGS: Moderate thoracic spondylosis. Midline trachea. Normal heart size
and mediastinal contours. No pleural effusion or pneumothorax. Clear
lungs.
IMPRESSION: No active cardiopulmonary disease.

## 2018-09-04 ENCOUNTER — Encounter: Payer: Self-pay | Admitting: Family Medicine

## 2018-09-04 ENCOUNTER — Ambulatory Visit: Payer: BC Managed Care – PPO | Admitting: Family Medicine

## 2018-09-04 VITALS — BP 110/62 | HR 84 | Temp 98.9°F | Ht 72.0 in | Wt 205.0 lb

## 2018-09-04 DIAGNOSIS — J4520 Mild intermittent asthma, uncomplicated: Secondary | ICD-10-CM | POA: Diagnosis not present

## 2018-09-04 DIAGNOSIS — J0100 Acute maxillary sinusitis, unspecified: Secondary | ICD-10-CM

## 2018-09-04 MED ORDER — GUAIFENESIN-CODEINE 100-10 MG/5ML PO SYRP: 5.0000 mL | ORAL_SOLUTION | Freq: Three times a day (TID) | ORAL | 0 refills | Status: DC | PRN

## 2018-09-04 MED ORDER — FLUTICASONE PROPIONATE 50 MCG/ACT NA SUSP: 1.0000 | Freq: Every day | NASAL | 11 refills | Status: DC

## 2018-09-04 MED ORDER — AMOXICILLIN-POT CLAVULANATE 875-125 MG PO TABS: 1.0000 | ORAL_TABLET | Freq: Two times a day (BID) | ORAL | 0 refills | Status: DC

## 2018-09-04 MED ORDER — ALBUTEROL SULFATE HFA 108 (90 BASE) MCG/ACT IN AERS: 2.0000 | INHALATION_SPRAY | Freq: Four times a day (QID) | RESPIRATORY_TRACT | 11 refills | Status: DC | PRN

## 2018-09-04 MED ORDER — BUDESONIDE-FORMOTEROL FUMARATE 80-4.5 MCG/ACT IN AERO: 2.0000 | INHALATION_SPRAY | Freq: Two times a day (BID) | RESPIRATORY_TRACT | 5 refills | Status: DC

## 2018-09-04 NOTE — Progress Notes (Signed)
Date:  09/04/2018   Name:  Eugene Kramer   DOB:  [birth year]   MRN:  [id number]   Chief Complaint: Sinusitis (cough with yellow/ green production- grandson had URI and mild case of pneumonia. ) and COPD (nasal spray and symbicort need to be refilled)  Sinusitis  This is a new problem. The current episode started in the past 7 days. The problem has been gradually worsening since onset. There has been no fever. The fever has been present for less than 1 day. The pain is mild. Associated symptoms include congestion and sinus pressure. Pertinent negatives include no chills, coughing, diaphoresis, ear pain, headaches, hoarse voice, neck pain, shortness of breath, sneezing, sore throat or swollen glands. Past treatments include nothing. The treatment provided moderate relief.  COPD  There is no cough, hoarse voice, shortness of breath or wheezing. Pertinent negatives include no chest pain, ear pain, fever, headaches, myalgias, sneezing or sore throat. His past medical history is significant for COPD.    Review of Systems  Constitutional: Negative for chills, diaphoresis and fever.  HENT: Positive for congestion and sinus pressure. Negative for drooling, ear discharge, ear pain, hoarse voice, sneezing and sore throat.   Respiratory: Negative for cough, shortness of breath and wheezing.   Cardiovascular: Negative for chest pain, palpitations and leg swelling.  Gastrointestinal: Negative for abdominal pain, blood in stool, constipation, diarrhea and nausea.  Endocrine: Negative for polydipsia.  Genitourinary: Negative for dysuria, frequency, hematuria and urgency.  Musculoskeletal: Negative for back pain, myalgias and neck pain.  Skin: Negative for rash.  Allergic/Immunologic: Negative for environmental allergies.  Neurological: Negative for dizziness and headaches.  Hematological: Does not bruise/bleed easily.  Psychiatric/Behavioral: Negative for suicidal ideas. The patient is not  nervous/anxious.     There are no active problems to display for this patient.   Allergies  Allergen Reactions  . Erythromycin     Past Surgical History:  Procedure Laterality Date  . COLONOSCOPY  10/25/2014   Dr Allen Norris  . HERNIA REPAIR    . TONSILLECTOMY AND ADENOIDECTOMY      Social History   Tobacco Use  . Smoking status: Former Research scientist (life sciences)  . Smokeless tobacco: Current User    Types: Snuff  Substance Use Topics  . Alcohol use: Yes    Alcohol/week: 0.0 standard drinks  . Drug use: No     Medication list has been reviewed and updated.  Current Meds  Medication Sig  . albuterol (PROVENTIL HFA;VENTOLIN HFA) 108 (90 Base) MCG/ACT inhaler Inhale 2 puffs into the lungs every 6 (six) hours as needed for wheezing or shortness of breath.  . APPLE CIDER VINEGAR PO Take 1 ampule by mouth daily.  Marland Kitchen aspirin 81 MG tablet Take 81 mg by mouth daily.  . budesonide-formoterol (SYMBICORT) 80-4.5 MCG/ACT inhaler Inhale 2 puffs into the lungs 2 (two) times daily.  . Cinnamon 500 MG TABS Take 1 tablet by mouth daily.  . fluticasone (FLONASE) 50 MCG/ACT nasal spray Place 1 spray into both nostrils daily.  . Garlic 889 MG CAPS Take 1 capsule by mouth daily.  Marland Kitchen ipratropium-albuterol (DUONEB) 0.5-2.5 (3) MG/3ML SOLN Take 3 mLs by nebulization every 6 (six) hours as needed.  . Misc Natural Products (PROSTATE HEALTH) CAPS Take 1 capsule by mouth daily.  . Omega-3 Fatty Acids (FISH OIL) 1000 MG CAPS Take 1 capsule by mouth daily.   Current Facility-Administered Medications for the 09/04/18 encounter (Office Visit) with Juline Patch, MD  Medication  . albuterol (  PROVENTIL) (2.5 MG/3ML) 0.083% nebulizer solution 2.5 mg    PHQ 2/9 Scores 04/06/2017  PHQ - 2 Score 1  PHQ- 9 Score 3    Physical Exam Vitals signs and nursing note reviewed.  HENT:     Head: Normocephalic.     Right Ear: External ear normal.     Left Ear: External ear normal.     Nose: Nose normal.  Eyes:     General: No  scleral icterus.       Right eye: No discharge.        Left eye: No discharge.     Conjunctiva/sclera: Conjunctivae normal.     Pupils: Pupils are equal, round, and reactive to light.  Neck:     Musculoskeletal: Normal range of motion and neck supple.     Thyroid: No thyromegaly.     Vascular: No JVD.     Trachea: No tracheal deviation.  Cardiovascular:     Rate and Rhythm: Normal rate and regular rhythm.     Heart sounds: Normal heart sounds. No murmur. No friction rub. No gallop.   Pulmonary:     Effort: No respiratory distress.     Breath sounds: Normal breath sounds. No wheezing or rales.  Abdominal:     General: Bowel sounds are normal.     Palpations: Abdomen is soft. There is no mass.     Tenderness: There is no abdominal tenderness. There is no guarding or rebound.  Musculoskeletal: Normal range of motion.        General: No tenderness.  Lymphadenopathy:     Cervical: No cervical adenopathy.  Skin:    General: Skin is warm.     Findings: No rash.  Neurological:     Mental Status: He is alert and oriented to person, place, and time.     Cranial Nerves: No cranial nerve deficit.     Deep Tendon Reflexes: Reflexes are normal and symmetric.     BP 110/62   Pulse 84   Temp 98.9 F (37.2 C) (Oral)   Ht 6' (1.829 m)   Wt 205 lb (93 kg)   SpO2 98%   BMI 27.80 kg/m   Assessment and Plan: 1. Acute maxillary sinusitis, recurrence not specified Acute onset.  Patient was started on Augmentin 875 twice daily for 10 days refill of his Flonase. - fluticasone (FLONASE) 50 MCG/ACT nasal spray; Place 1 spray into both nostrils daily.  Dispense: 16 g; Refill: 11  2. Mild intermittent asthma without complication Chronic intermittent mild refill albuterol inhaler 2 puffs every 6 hours as needed cough and Symbicort 80-4 0.5 2 puffs a day.  Patient has tenderness of left 12th rib consistent with contusion and/or costal strain. - albuterol (PROVENTIL HFA;VENTOLIN HFA) 108 (90 Base)  MCG/ACT inhaler; Inhale 2 puffs into the lungs every 6 (six) hours as needed for wheezing or shortness of breath.  Dispense: 1 Inhaler; Refill: 11 - budesonide-formoterol (SYMBICORT) 80-4.5 MCG/ACT inhaler; Inhale 2 puffs into the lungs 2 (two) times daily.  Dispense: 1 Inhaler; Refill: 5

## 2019-02-26 ENCOUNTER — Encounter: Payer: Self-pay | Admitting: Family Medicine

## 2019-02-26 ENCOUNTER — Other Ambulatory Visit: Payer: Self-pay

## 2019-02-26 ENCOUNTER — Ambulatory Visit (INDEPENDENT_AMBULATORY_CARE_PROVIDER_SITE_OTHER): Payer: BC Managed Care – PPO | Admitting: Family Medicine

## 2019-02-26 VITALS — BP 124/80 | HR 68 | Ht 72.0 in | Wt 203.0 lb

## 2019-02-26 DIAGNOSIS — Z0000 Encounter for general adult medical examination without abnormal findings: Secondary | ICD-10-CM

## 2019-02-26 DIAGNOSIS — R351 Nocturia: Secondary | ICD-10-CM | POA: Diagnosis not present

## 2019-02-26 DIAGNOSIS — Z1211 Encounter for screening for malignant neoplasm of colon: Secondary | ICD-10-CM | POA: Diagnosis not present

## 2019-02-26 LAB — HEMOCCULT GUIAC POC 1CARD (OFFICE): Fecal Occult Blood, POC: NEGATIVE

## 2019-02-26 NOTE — Progress Notes (Signed)
Date:  02/26/2019   Name:  Eugene Kramer   DOB:  [birth year]   MRN:  [id number]   Chief Complaint: Annual Exam and Nevus (large one on L) side of head)  Patient is a 63 year old male who presents for a comprehensive physical exam. The patient reports the following problems: nocturia. Health maintenance has been reviewed    Review of Systems  Constitutional: Negative for chills and fever.  HENT: Negative for drooling, ear discharge, ear pain and sore throat.   Respiratory: Negative for cough, shortness of breath and wheezing.   Cardiovascular: Negative for chest pain, palpitations and leg swelling.  Gastrointestinal: Negative for abdominal pain, blood in stool, constipation, diarrhea and nausea.  Endocrine: Negative for polydipsia.  Genitourinary: Negative for dysuria, frequency, hematuria and urgency.  Musculoskeletal: Negative for back pain, myalgias and neck pain.  Skin: Negative for rash.  Allergic/Immunologic: Negative for environmental allergies.  Neurological: Negative for dizziness and headaches.  Hematological: Does not bruise/bleed easily.  Psychiatric/Behavioral: Negative for suicidal ideas. The patient is not nervous/anxious.     There are no active problems to display for this patient.   Allergies  Allergen Reactions   Erythromycin     Past Surgical History:  Procedure Laterality Date   COLONOSCOPY  10/25/2014   Dr Allen Norris   HERNIA REPAIR     TONSILLECTOMY AND ADENOIDECTOMY      Social History   Tobacco Use   Smoking status: Former Smoker   Smokeless tobacco: Current User    Types: Snuff  Substance Use Topics   Alcohol use: Yes    Alcohol/week: 0.0 standard drinks   Drug use: No     Medication list has been reviewed and updated.  Current Meds  Medication Sig   APPLE CIDER VINEGAR PO Take 1 ampule by mouth daily.   aspirin 81 MG tablet Take 81 mg by mouth daily.   budesonide-formoterol (SYMBICORT) 80-4.5 MCG/ACT inhaler Inhale 2  puffs into the lungs 2 (two) times daily.   Cinnamon 500 MG TABS Take 1 tablet by mouth daily.   fluticasone (FLONASE) 50 MCG/ACT nasal spray Place 1 spray into both nostrils daily.   Garlic 627 MG CAPS Take 1 capsule by mouth daily.   Misc Natural Products (PROSTATE HEALTH) CAPS Take 1 capsule by mouth daily.   Omega-3 Fatty Acids (FISH OIL) 1000 MG CAPS Take 1 capsule by mouth daily.   Current Facility-Administered Medications for the 02/26/19 encounter (Office Visit) with Juline Patch, MD  Medication   albuterol (PROVENTIL) (2.5 MG/3ML) 0.083% nebulizer solution 2.5 mg    PHQ 2/9 Scores 04/06/2017  PHQ - 2 Score 1  PHQ- 9 Score 3    BP Readings from Last 3 Encounters:  02/26/19 124/80  09/04/18 110/62  06/26/17 110/78    Physical Exam Vitals signs and nursing note reviewed.  Constitutional:      Appearance: Normal appearance. He is well-developed, well-groomed and normal weight.  HENT:     Head: Normocephalic.     Jaw: There is normal jaw occlusion.     Right Ear: Hearing, tympanic membrane, ear canal and external ear normal. There is no impacted cerumen.     Left Ear: Hearing, tympanic membrane, ear canal and external ear normal. There is no impacted cerumen.     Nose: Nose normal. No nasal deformity, septal deviation, signs of injury, laceration, nasal tenderness, mucosal edema, congestion or rhinorrhea.     Right Sinus: No maxillary sinus tenderness or frontal  sinus tenderness.     Left Sinus: No maxillary sinus tenderness or frontal sinus tenderness.     Mouth/Throat:     Mouth: Mucous membranes are moist.     Pharynx: Oropharynx is clear.  Eyes:     General: Lids are normal. Vision grossly intact. Gaze aligned appropriately. No allergic shiner, visual field deficit or scleral icterus.       Right eye: No discharge.        Left eye: No discharge.     Extraocular Movements: Extraocular movements intact.     Conjunctiva/sclera: Conjunctivae normal.     Pupils:  Pupils are equal, round, and reactive to light.     Funduscopic exam:    Right eye: No AV nicking or arteriolar narrowing.        Left eye: No AV nicking or arteriolar narrowing.  Neck:     Musculoskeletal: Full passive range of motion without pain, normal range of motion and neck supple. Normal range of motion.     Thyroid: No thyroid mass, thyromegaly or thyroid tenderness.     Vascular: Normal carotid pulses. No carotid bruit, hepatojugular reflux or JVD.     Trachea: Trachea and phonation normal. No tracheal tenderness, abnormal tracheal secretions or tracheal deviation.  Cardiovascular:     Rate and Rhythm: Normal rate and regular rhythm.  No extrasystoles are present.    Chest Wall: PMI is not displaced. No thrill.     Pulses: Normal pulses. No decreased pulses.          Dorsalis pedis pulses are 2+ on the right side and 2+ on the left side.       Posterior tibial pulses are 2+ on the right side and 2+ on the left side.     Heart sounds: Normal heart sounds, S1 normal and S2 normal. No murmur. No systolic murmur. No diastolic murmur. No friction rub. No gallop. No S3 or S4 sounds.   Pulmonary:     Effort: Pulmonary effort is normal. No respiratory distress.     Breath sounds: Normal breath sounds. No decreased air movement or transmitted upper airway sounds. No decreased breath sounds, wheezing, rhonchi or rales.  Chest:     Chest wall: No mass.     Breasts:        Right: Normal. No mass.        Left: Normal. No mass.  Abdominal:     General: Bowel sounds are normal.     Palpations: Abdomen is soft. There is no hepatomegaly, splenomegaly or mass.     Tenderness: There is no abdominal tenderness. There is no right CVA tenderness, left CVA tenderness, guarding or rebound.     Hernia: There is no hernia in the left inguinal area or right inguinal area.  Genitourinary:    Pubic Area: No rash.      Penis: Circumcised.      Scrotum/Testes: Normal.        Right: Mass not present.          Left: Mass not present.     Epididymis:     Right: Normal.     Left: Normal.     Rectum: External hemorrhoid present.  Musculoskeletal: Normal range of motion.        General: No tenderness.     Cervical back: Normal.     Thoracic back: Normal.     Lumbar back: Normal.     Right lower leg: No edema.     Left  lower leg: No edema.  Feet:     Right foot:     Skin integrity: Skin integrity normal.     Left foot:     Skin integrity: Skin integrity normal.  Lymphadenopathy:     Head:     Right side of head: No submandibular adenopathy.     Left side of head: No submandibular adenopathy.     Cervical: No cervical adenopathy.     Right cervical: No superficial, deep or posterior cervical adenopathy.    Left cervical: No superficial, deep or posterior cervical adenopathy.     Upper Body:     Right upper body: No supraclavicular or axillary adenopathy.     Left upper body: No supraclavicular or axillary adenopathy.  Skin:    General: Skin is warm.     Capillary Refill: Capillary refill takes less than 2 seconds.     Findings: No rash.     Comments: Multiple seborrheic keratoses  Neurological:     General: No focal deficit present.     Mental Status: He is alert and oriented to person, place, and time.     Cranial Nerves: Cranial nerves are intact. No cranial nerve deficit or facial asymmetry.     Sensory: Sensation is intact.     Motor: Motor function is intact.     Deep Tendon Reflexes: Reflexes are normal and symmetric.  Psychiatric:        Attention and Perception: Attention normal.        Mood and Affect: Mood and affect normal.        Speech: Speech normal.     Wt Readings from Last 3 Encounters:  02/26/19 203 lb (92.1 kg)  09/04/18 205 lb (93 kg)  06/26/17 212 lb (96.2 kg)    BP 124/80    Pulse 68    Ht 6' (1.829 m)    Wt 203 lb (92.1 kg)    BMI 27.53 kg/m   Assessment and Plan:

## 2019-02-26 NOTE — Addendum Note (Signed)
Addended by: Fredderick Severance on: 02/26/2019 12:16 PM   Modules accepted: Orders

## 2019-02-27 LAB — PSA, TOTAL AND FREE
PSA, Free Pct: 25 %
PSA, Free: 0.15 ng/mL
Prostate Specific Ag, Serum: 0.6 ng/mL (ref 0.0–4.0)

## 2019-02-27 LAB — LIPID PANEL WITH LDL/HDL RATIO
Cholesterol, Total: 170 mg/dL (ref 100–199)
HDL: 44 mg/dL (ref >39)
LDL Calculated: 109 mg/dL — ABNORMAL HIGH (ref 0–99)
LDl/HDL Ratio: 2.5 ratio (ref 0.0–3.6)
Triglycerides: 87 mg/dL (ref 0–149)
VLDL Cholesterol Cal: 17 mg/dL (ref 5–40)

## 2019-02-27 LAB — RENAL FUNCTION PANEL
Albumin: 4.6 g/dL (ref 3.8–4.9)
BUN/Creatinine Ratio: 10 (ref 10–24)
BUN: 10 mg/dL (ref 8–27)
CO2: 25 mmol/L (ref 20–29)
Calcium: 9.4 mg/dL (ref 8.6–10.2)
Chloride: 102 mmol/L (ref 96–106)
Creatinine, Ser: 0.99 mg/dL (ref 0.76–1.27)
GFR calc Af Amer: 95 mL/min/{1.73_m2} (ref >59)
GFR calc non Af Amer: 82 mL/min/{1.73_m2} (ref >59)
Glucose: 86 mg/dL (ref 65–99)
Phosphorus: 3.6 mg/dL (ref 2.8–4.1)
Potassium: 4.4 mmol/L (ref 3.5–5.2)
Sodium: 141 mmol/L (ref 134–144)

## 2019-02-28 ENCOUNTER — Other Ambulatory Visit (INDEPENDENT_AMBULATORY_CARE_PROVIDER_SITE_OTHER): Payer: BC Managed Care – PPO

## 2019-02-28 ENCOUNTER — Other Ambulatory Visit: Payer: Self-pay

## 2019-02-28 DIAGNOSIS — Z1211 Encounter for screening for malignant neoplasm of colon: Secondary | ICD-10-CM

## 2019-02-28 LAB — HEMOCCULT GUIAC POC 1CARD (OFFICE)
Card #2 Fecal Occult Blod, POC: NEGATIVE
Card #3 Fecal Occult Blood, POC: NEGATIVE
Fecal Occult Blood, POC: NEGATIVE

## 2019-06-23 ENCOUNTER — Encounter: Payer: Self-pay | Admitting: Family Medicine

## 2019-06-24 ENCOUNTER — Encounter: Payer: Self-pay | Admitting: Family Medicine

## 2019-06-24 ENCOUNTER — Other Ambulatory Visit: Payer: Self-pay

## 2019-06-24 ENCOUNTER — Ambulatory Visit (INDEPENDENT_AMBULATORY_CARE_PROVIDER_SITE_OTHER): Payer: BC Managed Care – PPO | Admitting: Family Medicine

## 2019-06-24 VITALS — BP 132/82 | HR 73 | Resp 16 | Ht 72.0 in | Wt 204.0 lb

## 2019-06-24 DIAGNOSIS — N401 Enlarged prostate with lower urinary tract symptoms: Secondary | ICD-10-CM | POA: Diagnosis not present

## 2019-06-24 DIAGNOSIS — R1033 Periumbilical pain: Secondary | ICD-10-CM | POA: Diagnosis not present

## 2019-06-24 DIAGNOSIS — K5792 Diverticulitis of intestine, part unspecified, without perforation or abscess without bleeding: Secondary | ICD-10-CM

## 2019-06-24 DIAGNOSIS — K635 Polyp of colon: Secondary | ICD-10-CM

## 2019-06-24 DIAGNOSIS — N410 Acute prostatitis: Secondary | ICD-10-CM

## 2019-06-24 DIAGNOSIS — R3914 Feeling of incomplete bladder emptying: Secondary | ICD-10-CM

## 2019-06-24 LAB — POCT URINALYSIS DIPSTICK
Bilirubin, UA: NEGATIVE
Blood, UA: NEGATIVE
Glucose, UA: NEGATIVE
Ketones, UA: NEGATIVE
Leukocytes, UA: NEGATIVE
Nitrite, UA: NEGATIVE
Protein, UA: NEGATIVE
Spec Grav, UA: 1.015 (ref 1.010–1.025)
Urobilinogen, UA: 0.2 E.U./dL
pH, UA: 6.5 (ref 5.0–8.0)

## 2019-06-24 MED ORDER — METRONIDAZOLE 500 MG PO TABS: 500.0000 mg | ORAL_TABLET | Freq: Three times a day (TID) | ORAL | 0 refills | Status: DC

## 2019-06-24 MED ORDER — CIPROFLOXACIN HCL 500 MG PO TABS: 500.0000 mg | ORAL_TABLET | Freq: Two times a day (BID) | ORAL | 0 refills | Status: DC

## 2019-06-24 NOTE — Patient Instructions (Signed)
Diverticulitis  Diverticulitis is infection or inflammation of small pouches (diverticula) in the colon that form due to a condition called diverticulosis. Diverticula can trap stool (feces) and bacteria, causing infection and inflammation. Diverticulitis may cause severe stomach pain and diarrhea. It may lead to tissue damage in the colon that causes bleeding. The diverticula may also burst (rupture) and cause infected stool to enter other areas of the abdomen. Complications of diverticulitis can include:  Bleeding.  Severe infection.  Severe pain.  Rupture (perforation) of the colon.  Blockage (obstruction) of the colon. What are the causes? This condition is caused by stool becoming trapped in the diverticula, which allows bacteria to grow in the diverticula. This leads to inflammation and infection. What increases the risk? You are more likely to develop this condition if:  You have diverticulosis. The risk for diverticulosis increases if: ? You are overweight or obese. ? You use tobacco products. ? You do not get enough exercise.  You eat a diet that does not include enough fiber. High-fiber foods include fruits, vegetables, beans, nuts, and whole grains. What are the signs or symptoms? Symptoms of this condition may include:  Pain and tenderness in the abdomen. The pain is normally located on the left side of the abdomen, but it may occur in other areas.  Fever and chills.  Bloating.  Cramping.  Nausea.  Vomiting.  Changes in bowel routines.  Blood in your stool. How is this diagnosed? This condition is diagnosed based on:  Your medical history.  A physical exam.  Tests to make sure there is nothing else causing your condition. These tests may include: ? Blood tests. ? Urine tests. ? Imaging tests of the abdomen, including X-rays, ultrasounds, MRIs, or CT scans. How is this treated? Most cases of this condition are mild and can be treated at home.  Treatment may include:  Taking over-the-counter pain medicines.  Following a clear liquid diet.  Taking antibiotic medicines by mouth.  Rest. More severe cases may need to be treated at a hospital. Treatment may include:  Not eating or drinking.  Taking prescription pain medicine.  Receiving antibiotic medicines through an IV tube.  Receiving fluids and nutrition through an IV tube.  Surgery. When your condition is under control, your health care provider may recommend that you have a colonoscopy. This is an exam to look at the entire large intestine. During the exam, a lubricated, bendable tube is inserted into the anus and then passed into the rectum, colon, and other parts of the large intestine. A colonoscopy can show how severe your diverticula are and whether something else may be causing your symptoms. Follow these instructions at home: Medicines  Take over-the-counter and prescription medicines only as told by your health care provider. These include fiber supplements, probiotics, and stool softeners.  If you were prescribed an antibiotic medicine, take it as told by your health care provider. Do not stop taking the antibiotic even if you start to feel better.  Do not drive or use heavy machinery while taking prescription pain medicine. General instructions   Follow a full liquid diet or another diet as directed by your health care provider. After your symptoms improve, your health care provider may tell you to change your diet. He or she may recommend that you eat a diet that contains at least 25 g (25 grams) of fiber daily. Fiber makes it easier to pass stool. Healthy sources of fiber include: ? Berries. One cup contains 4-8 grams of   fiber. ? Beans or lentils. One half cup contains 5-8 grams of fiber. ? Green vegetables. One cup contains 4 grams of fiber.  Exercise for at least 30 minutes, 3 times each week. You should exercise hard enough to raise your heart rate and  break a sweat.  Keep all follow-up visits as told by your health care provider. This is important. You may need a colonoscopy. Contact a health care provider if:  Your pain does not improve.  You have a hard time drinking or eating food.  Your bowel movements do not return to normal. Get help right away if:  Your pain gets worse.  Your symptoms do not get better with treatment.  Your symptoms suddenly get worse.  You have a fever.  You vomit more than one time.  You have stools that are bloody, black, or tarry. Summary  Diverticulitis is infection or inflammation of small pouches (diverticula) in the colon that form due to a condition called diverticulosis. Diverticula can trap stool (feces) and bacteria, causing infection and inflammation.  You are at higher risk for this condition if you have diverticulosis and you eat a diet that does not include enough fiber.  Most cases of this condition are mild and can be treated at home. More severe cases may need to be treated at a hospital.  When your condition is under control, your health care provider may recommend that you have an exam called a colonoscopy. This exam can show how severe your diverticula are and whether something else may be causing your symptoms. This information is not intended to replace advice given to you by your health care provider. Make sure you discuss any questions you have with your health care provider. Document Released: 06/01/2005 Document Revised: 08/04/2017 Document Reviewed: 09/24/2016 Elsevier Patient Education  2020 Elsevier Inc.  

## 2019-06-24 NOTE — Progress Notes (Signed)
Date:  06/24/2019   Name:  Eugene Kramer   DOB:  Jan 15, 1956   MRN:  [id number]   Chief Complaint: Abdominal Pain (Strted yesterday lower abdominal pain w hx of prostate issues. )  Abdominal Pain This is a new problem. The current episode started yesterday. The onset quality is sudden. The problem occurs constantly. The pain is located in the suprapubic region. The pain is at a severity of 1/10. The pain is mild. The quality of the pain is aching. The abdominal pain radiates to the back. Associated symptoms include frequency. Pertinent negatives include no anorexia, arthralgias, belching, constipation, diarrhea, dysuria, fever, flatus, headaches, hematochezia, hematuria, melena, myalgias, nausea, vomiting or weight loss. Exacerbated by: sitting. Relieved by: hot shower. The treatment provided no relief.  Benign Prostatic Hypertrophy This is a chronic problem. The current episode started more than 1 year ago. Irritative symptoms include frequency and nocturia. Irritative symptoms do not include urgency. Obstructive symptoms include incomplete emptying. Pertinent negatives include no chills, dysuria, hematuria, nausea or vomiting. Treatments tried: otc. The treatment provided mild relief.    Review of Systems  Constitutional: Negative for chills, fever and weight loss.  HENT: Negative for drooling, ear discharge, ear pain and sore throat.   Respiratory: Negative for cough, shortness of breath and wheezing.   Cardiovascular: Negative for chest pain, palpitations and leg swelling.  Gastrointestinal: Positive for abdominal pain. Negative for anorexia, blood in stool, constipation, diarrhea, flatus, hematochezia, melena, nausea and vomiting.  Endocrine: Negative for polydipsia.  Genitourinary: Positive for difficulty urinating, frequency, incomplete emptying and nocturia. Negative for dysuria, hematuria and urgency.  Musculoskeletal: Negative for arthralgias, back pain, myalgias and neck pain.   Skin: Negative for rash.  Allergic/Immunologic: Negative for environmental allergies.  Neurological: Negative for dizziness and headaches.  Hematological: Does not bruise/bleed easily.  Psychiatric/Behavioral: Negative for suicidal ideas. The patient is not nervous/anxious.     There are no active problems to display for this patient.   Allergies  Allergen Reactions  . Erythromycin Swelling    Past Surgical History:  Procedure Laterality Date  . COLONOSCOPY  10/25/2014   Dr Allen Norris  . HERNIA REPAIR    . TONSILLECTOMY AND ADENOIDECTOMY      Social History   Tobacco Use  . Smoking status: Former Research scientist (life sciences)  . Smokeless tobacco: Current User    Types: Snuff  Substance Use Topics  . Alcohol use: Yes    Alcohol/week: 0.0 standard drinks  . Drug use: No     Medication list has been reviewed and updated.  Current Meds  Medication Sig  . Acetaminophen-Codeine (TYLENOL WITH CODEINE #3 PO) Take by mouth.  Marland Kitchen albuterol (PROVENTIL HFA;VENTOLIN HFA) 108 (90 Base) MCG/ACT inhaler Inhale 2 puffs into the lungs every 6 (six) hours as needed for wheezing or shortness of breath.  . APPLE CIDER VINEGAR PO Take 1 ampule by mouth daily.  Marland Kitchen aspirin 81 MG tablet Take 81 mg by mouth daily.  . budesonide-formoterol (SYMBICORT) 80-4.5 MCG/ACT inhaler Inhale 2 puffs into the lungs 2 (two) times daily.  . Cinnamon 500 MG TABS Take 1 tablet by mouth daily.  . fluticasone (FLONASE) 50 MCG/ACT nasal spray Place 1 spray into both nostrils daily.  . Garlic [id number] MG CAPS Take 1 capsule by mouth daily.  Marland Kitchen ipratropium-albuterol (DUONEB) 0.5-2.5 (3) MG/3ML SOLN Take 3 mLs by nebulization every 6 (six) hours as needed.  . Misc Natural Products (PROSTATE HEALTH) CAPS Take 1 capsule by mouth daily.  . Omega-3  Fatty Acids (FISH OIL) 1000 MG CAPS Take 1 capsule by mouth daily.   Current Facility-Administered Medications for the 06/24/19 encounter (Office Visit) with Juline Patch, MD  Medication  . albuterol  (PROVENTIL) (2.5 MG/3ML) 0.083% nebulizer solution 2.5 mg    PHQ 2/9 Scores 06/24/2019 04/06/2017  PHQ - 2 Score 0 1  PHQ- 9 Score - 3    BP Readings from Last 3 Encounters:  06/24/19 132/82  02/26/19 124/80  09/04/18 110/62    Physical Exam Vitals signs and nursing note reviewed.  HENT:     Head: Normocephalic.     Right Ear: External ear normal.     Left Ear: External ear normal.     Nose: Nose normal.     Mouth/Throat:     Mouth: Mucous membranes are moist.  Eyes:     General: No scleral icterus.       Right eye: No discharge.        Left eye: No discharge.     Conjunctiva/sclera: Conjunctivae normal.     Pupils: Pupils are equal, round, and reactive to light.  Neck:     Musculoskeletal: Normal range of motion and neck supple.     Thyroid: No thyromegaly.     Vascular: No JVD.     Trachea: No tracheal deviation.  Cardiovascular:     Rate and Rhythm: Normal rate and regular rhythm.     Heart sounds: Normal heart sounds. No murmur. No friction rub. No gallop.   Pulmonary:     Effort: No respiratory distress.     Breath sounds: Normal breath sounds. No wheezing or rales.  Abdominal:     General: Bowel sounds are normal.     Palpations: Abdomen is soft. There is no hepatomegaly, splenomegaly or mass.     Tenderness: There is abdominal tenderness in the right lower quadrant, suprapubic area and left lower quadrant. There is no right CVA tenderness, left CVA tenderness, guarding or rebound.  Genitourinary:    Prostate: Enlarged and tender.     Rectum: Normal.  Musculoskeletal: Normal range of motion.        General: No tenderness.  Lymphadenopathy:     Cervical: No cervical adenopathy.  Skin:    General: Skin is warm.     Findings: No rash.  Neurological:     Mental Status: He is alert and oriented to person, place, and time.     Cranial Nerves: No cranial nerve deficit.     Deep Tendon Reflexes: Reflexes are normal and symmetric.     Wt Readings from Last 3  Encounters:  06/24/19 204 lb (92.5 kg)  02/26/19 203 lb (92.1 kg)  09/04/18 205 lb (93 kg)    BP 132/82   Pulse 73   Resp 16   Ht 6' (1.829 m)   Wt 204 lb (92.5 kg)   BMI 27.67 kg/m   Assessment and Plan: 1. Periumbilical abdominal pain Patient with history of abdominal pain that began yesterday.  Patient has been primarily supine most of the day yesterday and comes as a walk-in today with suprapubic pain radiating to the back with the URI.  Review of the urinalysis is normal - POCT urinalysis dipstick  2. Diverticulitis Exam and history is consistent with diverticulitis.  We will get a CBC to get a baseline and we will initiate Cipro 500 mg twice a day and metronidazole 500 mg 3 times a day.  Patient has been told the importance of taking this  medication - CBC with Differential/Platelet - ciprofloxacin (CIPRO) 500 MG tablet; Take 1 tablet (500 mg total) by mouth 2 (two) times daily.  Dispense: 20 tablet; Refill: 0 - metroNIDAZOLE (FLAGYL) 500 MG tablet; Take 1 tablet (500 mg total) by mouth 3 (three) times daily.  Dispense: 30 tablet; Refill: 0  3. Acute prostatitis Patient also has a tender prostate but may be with infection.  Patient does have a feeling sitting on swelling in the perirectal area but there is no perirectal mass noted.  This is consistent with an acute prostatitis and we will initiate Cipro 500 mg twice a day. - ciprofloxacin (CIPRO) 500 MG tablet; Take 1 tablet (500 mg total) by mouth 2 (two) times daily.  Dispense: 20 tablet; Refill: 0  4. Benign prostatic hyperplasia with incomplete bladder emptying Patient had seen over 10 years ago Dr. Priscella Mann of which did not do well for the patient.  Therefore patient has gone several years without being rechecked for prostatitis/BPH and was unable to take the tamsulosin.  Patient has had increasing symptomatology of inability to empty bladder urinary frequency nocturia.  We will refer to urology on his desire to see Dr. Renelda Mom  with [facility] urologic. - Ambulatory referral to Urology  5. Polyp of descending colon, unspecified type Patient colonoscopy but we were unable to find record of this in the chart with Dr. Verl Blalock.  Apparently he had his procedure moved to [facility] and it was done by Dr. Verl Blalock at this facility and was noted to have a precancerous polyp.  This is in 2017 patient has been told that he is going to need to be repeated in 5 years and should hear from GI in discussion would have to be between he and gastroenterology as to referral to another gastroenterologist in [place].  There is also a remote possibility that his insurance may be able to cover his colonoscopy in [place] and Dr. Eddie Dibbles as well.

## 2019-06-25 ENCOUNTER — Encounter: Payer: Self-pay | Admitting: Family Medicine

## 2019-06-25 LAB — CBC WITH DIFFERENTIAL/PLATELET
Basophils Absolute: 0.1 10*3/uL (ref 0.0–0.2)
Basos: 1 %
EOS (ABSOLUTE): 0.2 10*3/uL (ref 0.0–0.4)
Eos: 2 %
Hematocrit: 46.1 % (ref 37.5–51.0)
Hemoglobin: 15.5 g/dL (ref 13.0–17.7)
Immature Grans (Abs): 0 10*3/uL (ref 0.0–0.1)
Immature Granulocytes: 0 %
Lymphocytes Absolute: 1.6 10*3/uL (ref 0.7–3.1)
Lymphs: 12 %
MCH: 29.7 pg (ref 26.6–33.0)
MCHC: 33.6 g/dL (ref 31.5–35.7)
MCV: 88 fL (ref 79–97)
Monocytes Absolute: 1.2 10*3/uL — ABNORMAL HIGH (ref 0.1–0.9)
Monocytes: 9 %
Neutrophils Absolute: 10 10*3/uL — ABNORMAL HIGH (ref 1.4–7.0)
Neutrophils: 76 %
Platelets: 189 10*3/uL (ref 150–450)
RBC: 5.22 x10E6/uL (ref 4.14–5.80)
RDW: 12.3 % (ref 11.6–15.4)
WBC: 13.1 10*3/uL — ABNORMAL HIGH (ref 3.4–10.8)

## 2019-07-18 ENCOUNTER — Encounter: Payer: Self-pay | Admitting: Urology

## 2019-07-18 ENCOUNTER — Ambulatory Visit: Payer: BC Managed Care – PPO | Admitting: Urology

## 2019-07-18 ENCOUNTER — Other Ambulatory Visit: Payer: Self-pay

## 2019-07-18 VITALS — BP 127/79 | HR 71 | Ht 72.0 in | Wt 203.4 lb

## 2019-07-18 DIAGNOSIS — R351 Nocturia: Secondary | ICD-10-CM | POA: Diagnosis not present

## 2019-07-18 DIAGNOSIS — Z125 Encounter for screening for malignant neoplasm of prostate: Secondary | ICD-10-CM

## 2019-07-18 NOTE — Patient Instructions (Signed)

## 2019-07-18 NOTE — Progress Notes (Signed)
   07/18/19 1:15 PM   Eugene Kramer 03/18/1956 [id number]  Referring provider: Juline Patch, MD [place],  [place] [address]  CC: Nocturia  HPI: I saw Eugene Kramer in urology clinic today in consultation for nocturia from Dr. Ronnald Kramer.  He is a healthy 63 year old male who reports bothersome nocturia 3-5 times per night.  He is a snorer, and has never been evaluated for sleep apnea.  He reports that he has had multiple episodes of " prostatitis" after DRE's in the past with fevers and joint aches that improved with antibiotics.  He denies any urinary symptoms during the day.  He denies any gross hematuria or dysuria.  He drinks primarily water, but occasionally will have some beers in the evening.  He previously tried Flomax from another provider many years ago, but he had severe lightheadedness and dizziness forcing him to discontinue this medication.  He reportedly has a family history of nonlethal prostate cancer in his elderly father who ultimately passed away from colon cancer.  PSAs have been well within the normal range, and was last checked in June 2020 and was 0.6.  PVR today in clinic is 2 mL.   PMH: Past Medical History:  Diagnosis Date  . Asthma     Surgical History: Past Surgical History:  Procedure Laterality Date  . COLONOSCOPY  10/25/2014   Dr Allen Norris  . HERNIA REPAIR    . TONSILLECTOMY AND ADENOIDECTOMY     Allergies:  Allergies  Allergen Reactions  . Erythromycin Swelling    Family History: No family history on file.  Social History:  reports that he has quit smoking. His smokeless tobacco use includes snuff. He reports current alcohol use. He reports that he does not use drugs.  ROS: Please see flowsheet from today's date for complete review of systems.  Physical Exam: BP 127/79 (BP Location: Left Arm, Patient Position: Sitting, Cuff Size: Normal)   Pulse 71   Ht 6' (1.829 m)   Wt 203 lb 6.4 oz (92.3 kg)   BMI 27.59 kg/m     Constitutional:  Alert and oriented, No acute distress. Cardiovascular: No clubbing, cyanosis, or edema. Respiratory: Normal respiratory effort, no increased work of breathing. GI: Abdomen is soft, nontender, nondistended, no abdominal masses GU: No CVA tenderness, phallus without lesions, widely patent meatus DRE: Patient deferred Lymph: No cervical or inguinal lymphadenopathy. Skin: No rashes, bruises or suspicious lesions. Neurologic: Grossly intact, no focal deficits, moving all 4 extremities. Psychiatric: Normal mood and affect.  Assessment & Plan:   In summary, he is a healthy 63 year old male with urinary symptoms consisting primarily of nocturia 3-5 times per night.  He has minimal symptoms during the day.  We discussed behavioral strategies including minimizing fluids in the evening, avoiding sodas, diet drinks, and alcohol, and double voiding prior to bed.  I also recommended evaluation for sleep apnea with his PCP.  If sleep apnea evaluation is negative, could consider desmopressin in the evenings if he would like to try a medication for his bothersome nocturia.  -Sleep apnea evaluation referral by PCP -RTC 3 months for symptom check and urinalysis, consider desmopressin if persistent bothersome nocturia  A total of 40 minutes were spent face-to-face with the patient, greater than 50% was spent in patient education, counseling, and coordination of care regarding urinary symptoms and nocturia.   Billey Co, [facility] [address] [place], [place] [address] [phone]

## 2019-07-24 ENCOUNTER — Other Ambulatory Visit: Payer: Self-pay | Admitting: Family Medicine

## 2019-09-11 ENCOUNTER — Encounter: Payer: Self-pay | Admitting: Family Medicine

## 2019-09-12 ENCOUNTER — Other Ambulatory Visit: Payer: Self-pay

## 2019-09-12 DIAGNOSIS — K219 Gastro-esophageal reflux disease without esophagitis: Secondary | ICD-10-CM

## 2019-09-12 DIAGNOSIS — R140 Abdominal distension (gaseous): Secondary | ICD-10-CM

## 2019-09-12 MED ORDER — METOCLOPRAMIDE HCL 10 MG PO TABS: 10.0000 mg | ORAL_TABLET | Freq: Two times a day (BID) | ORAL | 0 refills | Status: DC

## 2019-09-12 NOTE — Progress Notes (Unsigned)
Started Reglan bid sent to [facility]

## 2019-09-12 NOTE — Progress Notes (Unsigned)
Ref gast placed- Dr Allen Norris

## 2019-09-23 ENCOUNTER — Other Ambulatory Visit: Payer: Self-pay | Admitting: Family Medicine

## 2019-09-23 DIAGNOSIS — R351 Nocturia: Secondary | ICD-10-CM

## 2019-09-24 ENCOUNTER — Other Ambulatory Visit: Payer: Self-pay

## 2019-09-24 ENCOUNTER — Ambulatory Visit: Payer: BC Managed Care – PPO | Admitting: Urology

## 2019-09-24 ENCOUNTER — Other Ambulatory Visit
Admission: RE | Admit: 2019-09-24 | Discharge: 2019-09-24 | Disposition: A | Discharge status: Home / Self Care | Payer: BC Managed Care – PPO | Attending: Urology | Admitting: Urology

## 2019-09-24 VITALS — BP 135/78 | HR 75 | Ht 72.0 in | Wt 203.0 lb

## 2019-09-24 DIAGNOSIS — N529 Male erectile dysfunction, unspecified: Secondary | ICD-10-CM | POA: Diagnosis not present

## 2019-09-24 DIAGNOSIS — R351 Nocturia: Secondary | ICD-10-CM | POA: Diagnosis not present

## 2019-09-24 LAB — URINALYSIS, COMPLETE (UACMP) WITH MICROSCOPIC
Bacteria, UA: NONE SEEN
Bilirubin Urine: NEGATIVE
Glucose, UA: NEGATIVE mg/dL
Hgb urine dipstick: NEGATIVE
Ketones, ur: NEGATIVE mg/dL
Leukocytes,Ua: NEGATIVE
Nitrite: NEGATIVE
Protein, ur: NEGATIVE mg/dL
Specific Gravity, Urine: 1.025 (ref 1.005–1.030)
Squamous Epithelial / HPF: NONE SEEN (ref 0–5)
WBC, UA: NONE SEEN WBC/hpf (ref 0–5)
pH: 5.5 (ref 5.0–8.0)

## 2019-09-24 MED ORDER — TADALAFIL 5 MG PO TABS: 5.0000 mg | ORAL_TABLET | Freq: Every day | ORAL | 11 refills | Status: DC | PRN

## 2019-09-24 NOTE — Patient Instructions (Signed)
Tadalafil tablets (Cialis) What is this medicine? TADALAFIL (tah DA la fil) is used to treat erection problems in men. It is also used for enlargement of the prostate gland in men, a condition called benign prostatic hyperplasia or BPH. This medicine improves urine flow and reduces BPH symptoms. This medicine can also treat both erection problems and BPH when they occur together. This medicine may be used for other purposes; ask your health care provider or pharmacist if you have questions. COMMON BRAND NAME(S): Adcirca, ALYQ, Cialis What should I tell my health care provider before I take this medicine? They need to know if you have any of these conditions:  bleeding disorders  eye or vision problems, including a rare inherited eye disease called retinitis pigmentosa  anatomical deformation of the penis, Peyronie's disease, or history of priapism (painful and prolonged erection)  heart disease, angina, a history of heart attack, irregular heart beats, or other heart problems  high or low blood pressure  history of blood diseases, like sickle cell anemia or leukemia  history of stomach bleeding  kidney disease  liver disease  stroke  an unusual or allergic reaction to tadalafil, other medicines, foods, dyes, or preservatives  pregnant or trying to get pregnant  breast-feeding How should I use this medicine? Take this medicine by mouth with a glass of water. Follow the directions on the prescription label. You may take this medicine with or without meals. When this medicine is used for erection problems, your doctor may prescribe it to be taken once daily or as needed. If you are taking the medicine as needed, you may be able to have sexual activity 30 minutes after taking it and for up to 36 hours after taking it. Whether you are taking the medicine as needed or once daily, you should not take more than one dose per day. If you are taking this medicine for symptoms of benign  prostatic hyperplasia (BPH) or to treat both BPH and an erection problem, take the dose once daily at about the same time each day. Do not take your medicine more often than directed. Talk to your pediatrician regarding the use of this medicine in children. Special care may be needed. Overdosage: If you think you have taken too much of this medicine contact a poison control center or emergency room at once. NOTE: This medicine is only for you. Do not share this medicine with others. What if I miss a dose? If you are taking this medicine as needed for erection problems, this does not apply. If you miss a dose while taking this medicine once daily for an erection problem, benign prostatic hyperplasia, or both, take it as soon as you remember, but do not take more than one dose per day. What may interact with this medicine? Do not take this medicine with any of the following medications:  nitrates like amyl nitrite, isosorbide dinitrate, isosorbide mononitrate, nitroglycerin  other medicines for erectile dysfunction like avanafil, sildenafil, vardenafil  other tadalafil products (Adcirca)  riociguat This medicine may also interact with the following medications:  certain drugs for high blood pressure  certain drugs for the treatment of HIV infection or AIDS  certain drugs used for fungal or yeast infections, like fluconazole, itraconazole, ketoconazole, and voriconazole  certain drugs used for seizures like carbamazepine, phenytoin, and phenobarbital  grapefruit juice  macrolide antibiotics like clarithromycin, erythromycin, troleandomycin  medicines for prostate problems  rifabutin, rifampin or rifapentine This list may not describe all possible interactions. Give your   health care provider a list of all the medicines, herbs, non-prescription drugs, or dietary supplements you use. Also tell them if you smoke, drink alcohol, or use illegal drugs. Some items may interact with your  medicine. What should I watch for while using this medicine? If you notice any changes in your vision while taking this drug, call your doctor or health care professional as soon as possible. Stop using this medicine and call your health care provider right away if you have a loss of sight in one or both eyes. Contact your doctor or health care professional right away if the erection lasts longer than 4 hours or if it becomes painful. This may be a sign of serious problem and must be treated right away to prevent permanent damage. If you experience symptoms of nausea, dizziness, chest pain or arm pain upon initiation of sexual activity after taking this medicine, you should refrain from further activity and call your doctor or health care professional as soon as possible. Do not drink alcohol to excess (examples, 5 glasses of wine or 5 shots of whiskey) when taking this medicine. When taken in excess, alcohol can increase your chances of getting a headache or getting dizzy, increasing your heart rate or lowering your blood pressure. Using this medicine does not protect you or your partner against HIV infection (the virus that causes AIDS) or other sexually transmitted diseases. What side effects may I notice from receiving this medicine? Side effects that you should report to your doctor or health care professional as soon as possible:  allergic reactions like skin rash, itching or hives, swelling of the face, lips, or tongue  breathing problems  changes in hearing  changes in vision  chest pain  fast, irregular heartbeat  prolonged or painful erection  seizures Side effects that usually do not require medical attention (report to your doctor or health care professional if they continue or are bothersome):  back pain  dizziness  flushing  headache  indigestion  muscle aches  nausea  stuffy or runny nose This list may not describe all possible side effects. Call your doctor  for medical advice about side effects. You may report side effects to FDA at 1-800-FDA-1088. Where should I keep my medicine? Keep out of the reach of children. Store at room temperature between 15 and 30 degrees C (59 and 86 degrees F). Throw away any unused medicine after the expiration date. NOTE: This sheet is a summary. It may not cover all possible information. If you have questions about this medicine, talk to your doctor, pharmacist, or health care provider.  2020 Elsevier/Gold Standard (2014-01-10 13:15:49)  

## 2019-09-24 NOTE — Progress Notes (Signed)
   09/24/2019 5:40 PM   Harvie Heck 09-29-1955 [id number]  Reason for visit: Follow up nocturia  HPI: I saw Mr. Piekarski in urology clinic today for follow-up of nocturia.  He is a 64 year old male I previously saw in November for primary complaint of nocturia 3-5 times per night.  I recommended evaluation for sleep apnea, but he still has not been able to set this up.  He has been raising the head of his bed which he feels is improved his nocturia to 1-2 times per night.  He previously tried Flomax from another provider and had severe lightheadedness and dizziness from this medication.  PSAs have all been well within the normal range, most recently 0.6 in June 2020.  He also reports increasing difficulty with erections and is interested in trying a medication for this.  He has never tried medications for ED before.  He does not take any nitrates for chest pain.  Urinalysis today is benign with 0 RBCs, 0 WBCs, no bacteria, nitrite negative.  Regarding his nocturia, I read again recommended evaluation for sleep apnea, and we discussed behavioral strategies at length.  In terms of his erectile dysfunction, he would like to trial Cialis 5 mg on demand, prescription provided, risks and benefits discussed  Follow-up as needed  A total of 20 minutes were spent face-to-face with the patient, greater than 50% was spent in patient education, counseling, and coordination of care regarding nocturia and erectile dysfunction.  Billey Co, [facility] [address], [place], [place] [address] [phone]

## 2019-10-02 ENCOUNTER — Encounter: Payer: Self-pay | Admitting: Gastroenterology

## 2019-10-02 ENCOUNTER — Other Ambulatory Visit: Payer: Self-pay

## 2019-10-02 ENCOUNTER — Ambulatory Visit (INDEPENDENT_AMBULATORY_CARE_PROVIDER_SITE_OTHER): Payer: BC Managed Care – PPO | Admitting: Gastroenterology

## 2019-10-02 VITALS — BP 128/78 | HR 67 | Temp 97.5°F | Ht 72.0 in | Wt 211.0 lb

## 2019-10-02 DIAGNOSIS — K219 Gastro-esophageal reflux disease without esophagitis: Secondary | ICD-10-CM

## 2019-10-02 DIAGNOSIS — F458 Other somatoform disorders: Secondary | ICD-10-CM | POA: Diagnosis not present

## 2019-10-02 MED ORDER — PANTOPRAZOLE SODIUM 40 MG PO TBEC: 40.0000 mg | DELAYED_RELEASE_TABLET | Freq: Every day | ORAL | 6 refills | Status: DC

## 2019-10-02 NOTE — Progress Notes (Signed)
Gastroenterology Consultation  Referring Provider:     Juline Patch, MD Primary Care Physician:  Juline Patch, MD Primary Gastroenterologist:  Dr. Allen Norris     Reason for Consultation:     Abdominal pain        HPI:   Eugene Kramer is a 64 y.o. y/o male referred for consultation & management of abdominal pain by Dr. Juline Patch, MD.  This patient was seen in October 2020 by his primary care provider and was reporting abdominal pain at that time.  The patient was started on antibiotics for possible diverticulitis.  The patient had a colonoscopy by me back in 2008.  The patient has been following up with urology for nocturia and BPH.  At the beginning of January the patient had a prescription called in for Reglan and the referral was put in for GI due to bloating, flatulence, belching and abdominal cramps. The patient reports that he started taking over-the-counter Prevacid and states that his symptoms have improved.  There is no report of any unexplained weight loss fevers chills nausea vomiting.  The patient does chew gum and does drink beer as possible causes of his increased gas and bloating.  He also reports that he sleeps with his bed elevated which he says decreases the frequency of him needing to urinate.  Past Medical History:  Diagnosis Date  . Asthma     Past Surgical History:  Procedure Laterality Date  . COLONOSCOPY  10/25/2014   Dr Allen Norris  . HERNIA REPAIR    . TONSILLECTOMY AND ADENOIDECTOMY      Prior to Admission medications   Medication Sig Start Date End Date Taking? Authorizing Provider  APPLE CIDER VINEGAR PO Take 1 ampule by mouth daily.    [provider]  aspirin 81 MG tablet Take 81 mg by mouth daily.    [provider]  Cinnamon 500 MG TABS Take 1 tablet by mouth daily.    [provider]  fluticasone (FLONASE) 50 MCG/ACT nasal spray Place 1 spray into both nostrils daily. 09/04/18   Juline Patch, MD  Garlic [id number] MG CAPS  Take 1 capsule by mouth daily.    [provider]  ipratropium-albuterol (DUONEB) 0.5-2.5 (3) MG/3ML SOLN Take 3 mLs by nebulization every 6 (six) hours as needed. 09/16/16   Juline Patch, MD  Misc Natural Products [facility]) CAPS Take 1 capsule by mouth daily.    [provider]  Omega-3 Fatty Acids (FISH OIL) 1000 MG CAPS Take 1 capsule by mouth daily.    [provider]  Probiotic Product (PROBIOTIC DAILY PO) Take by mouth daily.    [provider]  tadalafil (CIALIS) 5 MG tablet Take 1 tablet (5 mg total) by mouth daily as needed for erectile dysfunction. 09/24/19   Billey Co, MD  UNABLE TO FIND daily. Med Name: CBD Oil    [provider]    No family history on file.   Social History   Tobacco Use  . Smoking status: Former Research scientist (life sciences)  . Smokeless tobacco: Current User    Types: Snuff  Substance Use Topics  . Alcohol use: Yes    Alcohol/week: 0.0 standard drinks  . Drug use: No    Allergies as of 10/02/2019 - Review Complete 09/24/2019  Allergen Reaction Noted  . Erythromycin Swelling 12/10/2015    Review of Systems:    All systems reviewed and negative except where noted in HPI.  Physical Exam:  There were no vitals taken for this visit. No LMP for male patient. General:   Alert,  Well-developed, well-nourished, pleasant and cooperative in NAD Head:  Normocephalic and atraumatic. Eyes:  Sclera clear, no icterus.   Conjunctiva pink. Ears:  Normal auditory acuity. Neck:  Supple; no masses or thyromegaly. Lungs:  Respirations even and unlabored.  Clear throughout to auscultation.   No wheezes, crackles, or rhonchi. No acute distress. Heart:  Regular rate and rhythm; no murmurs, clicks, rubs, or gallops. Abdomen:  Normal bowel sounds.  No bruits.  Soft, non-tender and non-distended without masses, hepatosplenomegaly or hernias noted.  No guarding or rebound tenderness.  Negative Carnett sign.   Rectal:  Deferred.    Pulses:  Normal pulses noted. Extremities:  No clubbing or edema.  No cyanosis. Neurologic:  Alert and oriented x3;  grossly normal neurologically. Skin:  Intact without significant lesions or rashes.  No jaundice. Lymph Nodes:  No significant cervical adenopathy. Psych:  Alert and cooperative. Normal mood and affect.  Imaging Studies: No results found.  Assessment and Plan:   Eugene Kramer is a 64 y.o. y/o male who comes in with bloating and burping which is likely caused by aerophagia.  The patient has been told that chewing gum and his beer drinking brings gas down to his intestines.  He has also been told that since his symptoms got better on Prevacid he may have a component of reflux causing the aerophagia.  The patient has been started on Protonix 40 mg a day.  He has also been told to try and be aware of what may be causing his increased in gas intake and to try and avoid it.  The patient has been explained the plan and agrees with it.    [name], MD. Marval Regal    Note: This dictation was prepared with Dragon dictation along with smaller phrase technology. Any transcriptional errors that result from this process are unintentional.

## 2019-10-21 ENCOUNTER — Ambulatory Visit: Payer: BC Managed Care – PPO | Admitting: Urology

## 2020-02-27 ENCOUNTER — Encounter: Payer: BC Managed Care – PPO | Admitting: Family Medicine

## 2020-03-10 ENCOUNTER — Encounter: Payer: Self-pay | Admitting: Family Medicine

## 2020-03-10 ENCOUNTER — Other Ambulatory Visit: Payer: Self-pay

## 2020-03-10 ENCOUNTER — Ambulatory Visit (INDEPENDENT_AMBULATORY_CARE_PROVIDER_SITE_OTHER): Payer: BC Managed Care – PPO | Admitting: Family Medicine

## 2020-03-10 VITALS — BP 130/80 | HR 64 | Ht 72.0 in | Wt 207.0 lb

## 2020-03-10 DIAGNOSIS — Z0000 Encounter for general adult medical examination without abnormal findings: Secondary | ICD-10-CM | POA: Diagnosis not present

## 2020-03-10 DIAGNOSIS — R351 Nocturia: Secondary | ICD-10-CM

## 2020-03-10 DIAGNOSIS — J4520 Mild intermittent asthma, uncomplicated: Secondary | ICD-10-CM | POA: Diagnosis not present

## 2020-03-10 DIAGNOSIS — J301 Allergic rhinitis due to pollen: Secondary | ICD-10-CM

## 2020-03-10 MED ORDER — BUDESONIDE-FORMOTEROL FUMARATE 80-4.5 MCG/ACT IN AERO: 2.0000 | INHALATION_SPRAY | Freq: Every morning | RESPIRATORY_TRACT | 12 refills | Status: DC

## 2020-03-10 MED ORDER — FLUTICASONE PROPIONATE 50 MCG/ACT NA SUSP: 1.0000 | Freq: Every day | NASAL | 11 refills | Status: DC

## 2020-03-10 NOTE — Progress Notes (Signed)
Date:  03/10/2020   Name:  Eugene Kramer   DOB:  [birth year]   MRN:  [id number]   Chief Complaint: Annual Exam  Patient is a 64 year old male who presents for a comprehensive physical exam. The patient reports the following problems: none. Health maintenance has been reviewed colonoscopy 2017.   Lab Results  Component Value Date   CREATININE 0.99 02/26/2019   BUN 10 02/26/2019   NA 141 02/26/2019   K 4.4 02/26/2019   CL 102 02/26/2019   CO2 25 02/26/2019   Lab Results  Component Value Date   CHOL 170 02/26/2019   HDL 44 02/26/2019   LDLCALC 109 (H) 02/26/2019   TRIG 87 02/26/2019   CHOLHDL 4.8 04/06/2017   No results found for: TSH No results found for: HGBA1C Lab Results  Component Value Date   WBC 13.1 (H) 06/24/2019   HGB 15.5 06/24/2019   HCT 46.1 06/24/2019   MCV 88 06/24/2019   PLT 189 06/24/2019   No results found for: ALT, AST, GGT, ALKPHOS, BILITOT   Review of Systems  Constitutional: Negative for chills and fever.  HENT: Negative for drooling, ear discharge, ear pain and sore throat.   Respiratory: Negative for cough, shortness of breath and wheezing.   Cardiovascular: Negative for chest pain, palpitations and leg swelling.  Gastrointestinal: Negative for abdominal pain, blood in stool, constipation, diarrhea and nausea.  Endocrine: Negative for polydipsia.  Genitourinary: Negative for dysuria, frequency, hematuria and urgency.  Musculoskeletal: Negative for back pain, myalgias and neck pain.  Skin: Negative for rash.  Allergic/Immunologic: Negative for environmental allergies.  Neurological: Negative for dizziness and headaches.  Hematological: Does not bruise/bleed easily.  Psychiatric/Behavioral: Negative for suicidal ideas. The patient is not nervous/anxious.     There are no problems to display for this patient.   Allergies  Allergen Reactions  . Erythromycin Swelling    Past Surgical History:  Procedure Laterality Date  .  COLONOSCOPY  10/25/2014   Dr Allen Norris  . HERNIA REPAIR    . TONSILLECTOMY AND ADENOIDECTOMY      Social History   Tobacco Use  . Smoking status: Former Research scientist (life sciences)  . Smokeless tobacco: Current User    Types: Snuff  Substance Use Topics  . Alcohol use: Yes    Alcohol/week: 0.0 standard drinks  . Drug use: No     Medication list has been reviewed and updated.  Current Meds  Medication Sig  . APPLE CIDER VINEGAR PO Take 1 ampule by mouth daily.  Marland Kitchen aspirin 81 MG tablet Take 81 mg by mouth daily.  . Cinnamon 500 MG TABS Take 1 tablet by mouth daily.  . fluticasone (FLONASE) 50 MCG/ACT nasal spray Place 1 spray into both nostrils daily.  . Garlic 427 MG CAPS Take 1 capsule by mouth daily.  Marland Kitchen ipratropium-albuterol (DUONEB) 0.5-2.5 (3) MG/3ML SOLN Take 3 mLs by nebulization every 6 (six) hours as needed.  . Misc Natural Products (PROSTATE HEALTH) CAPS Take 1 capsule by mouth daily.  . Omega-3 Fatty Acids (FISH OIL) 1000 MG CAPS Take 1 capsule by mouth daily.  . pantoprazole (PROTONIX) 40 MG tablet Take 1 tablet (40 mg total) by mouth daily.  . tadalafil (CIALIS) 5 MG tablet Take 1 tablet (5 mg total) by mouth daily as needed for erectile dysfunction.  Marland Kitchen UNABLE TO FIND daily. Med Name: CBD Oil  . [DISCONTINUED] Probiotic Product (PROBIOTIC DAILY PO) Take by mouth daily.   Current Facility-Administered Medications for the  03/10/20 encounter (Office Visit) with Juline Patch, MD  Medication  . albuterol (PROVENTIL) (2.5 MG/3ML) 0.083% nebulizer solution 2.5 mg    PHQ 2/9 Scores 03/10/2020 06/24/2019 04/06/2017  PHQ - 2 Score 0 0 1  PHQ- 9 Score 0 - 3    GAD 7 : Generalized Anxiety Score 03/10/2020  Nervous, Anxious, on Edge 1  Control/stop worrying 0  Worry too much - different things 1  Trouble relaxing 0  Restless 0  Easily annoyed or irritable 0  Afraid - awful might happen 0  Total GAD 7 Score 2  Anxiety Difficulty Not difficult at all    BP Readings from Last 3 Encounters:    03/10/20 130/80  10/02/19 128/78  09/24/19 135/78    Physical Exam Vitals and nursing note reviewed.  Constitutional:      Appearance: Normal appearance. He is well-developed, well-groomed and normal weight.  HENT:     Head: Normocephalic.     Jaw: There is normal jaw occlusion.     Right Ear: Hearing, tympanic membrane, ear canal and external ear normal.     Left Ear: Hearing, tympanic membrane, ear canal and external ear normal.     Nose: Nose normal.     Right Turbinates: Not enlarged.     Left Turbinates: Not enlarged.     Mouth/Throat:     Lips: Pink.     Mouth: Mucous membranes are moist.     Dentition: Normal dentition.     Tongue: No lesions.     Pharynx: Oropharynx is clear. Uvula midline.  Eyes:     General: Lids are normal. Vision grossly intact. Gaze aligned appropriately. No scleral icterus.       Right eye: No discharge.        Left eye: No discharge.     Extraocular Movements: Extraocular movements intact.     Conjunctiva/sclera: Conjunctivae normal.     Pupils: Pupils are equal, round, and reactive to light.     Funduscopic exam:    Right eye: Red reflex present.        Left eye: Red reflex present. Neck:     Thyroid: No thyroid mass, thyromegaly or thyroid tenderness.     Vascular: Normal carotid pulses. No carotid bruit, hepatojugular reflux or JVD.     Trachea: Trachea and phonation normal. No tracheal deviation.  Cardiovascular:     Rate and Rhythm: Normal rate and regular rhythm.     Chest Wall: PMI is not displaced. No thrill.     Pulses: Normal pulses.          Carotid pulses are 2+ on the right side and 2+ on the left side.      Radial pulses are 2+ on the right side and 2+ on the left side.       Femoral pulses are 2+ on the right side and 2+ on the left side.      Popliteal pulses are 2+ on the right side and 2+ on the left side.       Dorsalis pedis pulses are 2+ on the right side and 2+ on the left side.       Posterior tibial pulses are 2+  on the right side and 2+ on the left side.     Heart sounds: Normal heart sounds and S1 normal. No murmur heard.  No systolic murmur is present.  No diastolic murmur is present.  No friction rub. No gallop. No S3 or S4 sounds.  Pulmonary:     Effort: Pulmonary effort is normal. No respiratory distress.     Breath sounds: Normal breath sounds. No decreased breath sounds, wheezing, rhonchi or rales.  Chest:     Breasts: Breasts are symmetrical.        Right: Normal.        Left: Normal.  Abdominal:     General: Bowel sounds are normal.     Palpations: Abdomen is soft. There is no hepatomegaly, splenomegaly or mass.     Tenderness: There is no abdominal tenderness. There is no right CVA tenderness, left CVA tenderness, guarding or rebound.     Hernia: No hernia is present. There is no hernia in the left inguinal area or right inguinal area.  Genitourinary:    Penis: Normal and uncircumcised.      Testes: Normal.        Right: Mass not present.        Left: Mass not present.     Epididymis:     Right: Normal.     Left: Normal.     Comments: Refuse internal exam Musculoskeletal:        General: No tenderness. Normal range of motion.     Cervical back: Full passive range of motion without pain, normal range of motion and neck supple.     Right lower leg: No edema.     Left lower leg: No edema.  Lymphadenopathy:     Head:     Right side of head: No submental or submandibular adenopathy.     Left side of head: No submental or submandibular adenopathy.     Cervical: No cervical adenopathy.     Right cervical: No superficial, deep or posterior cervical adenopathy.    Left cervical: No superficial, deep or posterior cervical adenopathy.     Upper Body:     Right upper body: No supraclavicular or axillary adenopathy.     Left upper body: No supraclavicular or axillary adenopathy.     Lower Body: No right inguinal adenopathy. No left inguinal adenopathy.  Skin:    General: Skin is  warm.     Capillary Refill: Capillary refill takes less than 2 seconds.     Findings: No rash.  Neurological:     Mental Status: He is alert and oriented to person, place, and time.     Cranial Nerves: Cranial nerves are intact. No cranial nerve deficit.     Motor: No tremor.     Deep Tendon Reflexes:     Reflex Scores:      Tricep reflexes are 1+ on the right side and 1+ on the left side.      Bicep reflexes are 1+ on the right side and 1+ on the left side.      Brachioradialis reflexes are 1+ on the right side and 1+ on the left side.      Patellar reflexes are 1+ on the right side and 1+ on the left side.      Achilles reflexes are 1+ on the right side and 1+ on the left side. Psychiatric:        Behavior: Behavior is cooperative.     Wt Readings from Last 3 Encounters:  03/10/20 207 lb (93.9 kg)  10/02/19 211 lb (95.7 kg)  09/24/19 203 lb (92.1 kg)    BP 130/80   Pulse 64   Ht 6' (1.829 m)   Wt 207 lb (93.9 kg)   BMI 28.07 kg/m  Assessment and Plan:

## 2020-03-11 LAB — COMPREHENSIVE METABOLIC PANEL
ALT: 19 IU/L (ref 0–44)
AST: 22 IU/L (ref 0–40)
Albumin/Globulin Ratio: 2.4 — ABNORMAL HIGH (ref 1.2–2.2)
Albumin: 4.6 g/dL (ref 3.8–4.8)
Alkaline Phosphatase: 100 IU/L (ref 48–121)
BUN/Creatinine Ratio: 9 — ABNORMAL LOW (ref 10–24)
BUN: 9 mg/dL (ref 8–27)
Bilirubin Total: 0.5 mg/dL (ref 0.0–1.2)
CO2: 26 mmol/L (ref 20–29)
Calcium: 9.5 mg/dL (ref 8.6–10.2)
Chloride: 102 mmol/L (ref 96–106)
Creatinine, Ser: 1.05 mg/dL (ref 0.76–1.27)
GFR calc Af Amer: 88 mL/min/{1.73_m2} (ref >59)
GFR calc non Af Amer: 76 mL/min/{1.73_m2} (ref >59)
Globulin, Total: 1.9 g/dL (ref 1.5–4.5)
Glucose: 83 mg/dL (ref 65–99)
Potassium: 4.2 mmol/L (ref 3.5–5.2)
Sodium: 141 mmol/L (ref 134–144)
Total Protein: 6.5 g/dL (ref 6.0–8.5)

## 2020-03-11 LAB — CBC WITH DIFFERENTIAL/PLATELET
Basophils Absolute: 0.1 10*3/uL (ref 0.0–0.2)
Basos: 1 %
EOS (ABSOLUTE): 0.5 10*3/uL — ABNORMAL HIGH (ref 0.0–0.4)
Eos: 7 %
Hematocrit: 44.5 % (ref 37.5–51.0)
Hemoglobin: 14.9 g/dL (ref 13.0–17.7)
Immature Grans (Abs): 0 10*3/uL (ref 0.0–0.1)
Immature Granulocytes: 0 %
Lymphocytes Absolute: 2 10*3/uL (ref 0.7–3.1)
Lymphs: 28 %
MCH: 30 pg (ref 26.6–33.0)
MCHC: 33.5 g/dL (ref 31.5–35.7)
MCV: 90 fL (ref 79–97)
Monocytes Absolute: 0.5 10*3/uL (ref 0.1–0.9)
Monocytes: 8 %
Neutrophils Absolute: 3.9 10*3/uL (ref 1.4–7.0)
Neutrophils: 56 %
Platelets: 180 10*3/uL (ref 150–450)
RBC: 4.96 x10E6/uL (ref 4.14–5.80)
RDW: 13 % (ref 11.6–15.4)
WBC: 7 10*3/uL (ref 3.4–10.8)

## 2020-03-11 LAB — PSA: Prostate Specific Ag, Serum: 0.6 ng/mL (ref 0.0–4.0)

## 2020-03-11 LAB — LIPID PANEL WITH LDL/HDL RATIO
Cholesterol, Total: 211 mg/dL — ABNORMAL HIGH (ref 100–199)
HDL: 42 mg/dL (ref >39)
LDL Chol Calc (NIH): 133 mg/dL — ABNORMAL HIGH (ref 0–99)
LDL/HDL Ratio: 3.2 ratio (ref 0.0–3.6)
Triglycerides: 204 mg/dL — ABNORMAL HIGH (ref 0–149)
VLDL Cholesterol Cal: 36 mg/dL (ref 5–40)

## 2020-03-12 NOTE — Patient Instructions (Signed)

## 2020-03-15 ENCOUNTER — Encounter: Payer: Self-pay | Admitting: Family Medicine

## 2020-04-16 ENCOUNTER — Encounter: Payer: Self-pay | Admitting: Family Medicine

## 2020-04-21 ENCOUNTER — Telehealth: Payer: Self-pay | Admitting: Family Medicine

## 2020-04-21 NOTE — Telephone Encounter (Signed)
Copied from [place] [id number]. Topic: General - Other >> Apr 21, 2020  8:15 AM Alanda Slim E wrote: Reason for CRM: Pt called to speak with Baxter Flattery about scheduling a lab for 6 week follow up/ please advise

## 2020-04-22 ENCOUNTER — Other Ambulatory Visit: Payer: Self-pay

## 2020-04-22 DIAGNOSIS — E782 Mixed hyperlipidemia: Secondary | ICD-10-CM

## 2020-04-22 NOTE — Telephone Encounter (Signed)
Spoke with patient and he said he would like to come back and have his cholesterol rechecked since its been almost 6 weeks and he has been working hard on his diet. Told him to pick up the labs at the front desk when he is fasting and have his blood drawn.   CM

## 2020-04-22 NOTE — Progress Notes (Signed)
lip

## 2020-04-24 LAB — LIPID PANEL
Chol/HDL Ratio: 5.2 ratio — ABNORMAL HIGH (ref 0.0–5.0)
Cholesterol, Total: 214 mg/dL — ABNORMAL HIGH (ref 100–199)
HDL: 41 mg/dL (ref >39)
LDL Chol Calc (NIH): 150 mg/dL — ABNORMAL HIGH (ref 0–99)
Triglycerides: 125 mg/dL (ref 0–149)
VLDL Cholesterol Cal: 23 mg/dL (ref 5–40)

## 2021-03-11 ENCOUNTER — Other Ambulatory Visit: Payer: Self-pay

## 2021-03-11 ENCOUNTER — Encounter: Payer: Self-pay | Admitting: Family Medicine

## 2021-03-11 ENCOUNTER — Ambulatory Visit (INDEPENDENT_AMBULATORY_CARE_PROVIDER_SITE_OTHER): Payer: BC Managed Care – PPO | Admitting: Family Medicine

## 2021-03-11 VITALS — BP 120/80 | HR 68 | Ht 72.0 in | Wt 203.0 lb

## 2021-03-11 DIAGNOSIS — J301 Allergic rhinitis due to pollen: Secondary | ICD-10-CM | POA: Diagnosis not present

## 2021-03-11 DIAGNOSIS — Z0000 Encounter for general adult medical examination without abnormal findings: Secondary | ICD-10-CM

## 2021-03-11 DIAGNOSIS — E782 Mixed hyperlipidemia: Secondary | ICD-10-CM | POA: Diagnosis not present

## 2021-03-11 DIAGNOSIS — J4520 Mild intermittent asthma, uncomplicated: Secondary | ICD-10-CM

## 2021-03-11 DIAGNOSIS — M7660 Achilles tendinitis, unspecified leg: Secondary | ICD-10-CM

## 2021-03-11 MED ORDER — ALBUTEROL SULFATE HFA 108 (90 BASE) MCG/ACT IN AERS: 2.0000 | INHALATION_SPRAY | Freq: Four times a day (QID) | RESPIRATORY_TRACT | 6 refills | Status: DC | PRN

## 2021-03-11 MED ORDER — FLUTICASONE PROPIONATE 50 MCG/ACT NA SUSP: 1.0000 | Freq: Every day | NASAL | 11 refills | Status: DC

## 2021-03-11 MED ORDER — BUDESONIDE-FORMOTEROL FUMARATE 80-4.5 MCG/ACT IN AERO: 2.0000 | INHALATION_SPRAY | Freq: Every morning | RESPIRATORY_TRACT | 12 refills | Status: DC

## 2021-03-11 NOTE — Patient Instructions (Addendum)
[employer]'s Emergency Medicine: Concepts and Clinical Practice (9th ed., pp. 8527-7824). [place], [place]: [place]. Retrieved from https://www.[email or web address].[email or web address]  Achilles Tendinitis  Achilles tendinitis is inflammation of the tough, cord-like band that attaches the lower leg muscles to the heel bone (Achilles tendon). This is usually caused by overusing the tendon and the ankle joint. Achilles tendinitis usually gets better over time with treatment and caring foryourself at home. It can take weeks or months to heal completely. What are the causes? This condition may be caused by: A sudden increase in exercise or activity, such as running. Doing the same exercises or activities, such as jumping, over and over. Not warming up calf muscles before exercising. Exercising in shoes that are worn out or not made for exercise. Having arthritis or a bone growth (spur) on the back of the heel bone. This can rub against the tendon and hurt it. Age-related wear and tear. Tendons become less flexible with age and are more likely to be injured. What are the signs or symptoms? Common symptoms of this condition include: Pain in the Achilles tendon or in the back of the leg, just above the heel. The pain usually gets worse with exercise. Stiffness or soreness in the back of the leg, especially in the morning. Swelling of the skin over the Achilles tendon. Thickening of the tendon. Trouble standing on tiptoe. How is this diagnosed? This condition is diagnosed based on your symptoms and a physical exam. You may have tests, including: X-rays. MRI. How is this treated? The goal of treatment is to relieve symptoms and help your injury heal. Treatment may include: Decreasing or stopping activities that caused the tendinitis. This may mean switching to low-impact exercises like biking or swimming. Icing the injured area. Doing physical therapy,  including strengthening and stretching exercises. Taking NSAIDs, such as ibuprofen, to help relieve pain and swelling. Using supportive shoes, wraps, heel lifts, or a walking boot (air cast). Having surgery. This may be done if your symptoms do not improve after other treatments. Using high-energy shock wave impulses to stimulate the healing process (extracorporeal shock wave therapy). This is rare. Having an injection of medicines that help relieve inflammation (corticosteroids). This is rare. Follow these instructions at home: If you have an air cast: Wear the air cast as told by your health care provider. Remove it only as told by your health care provider. Loosen it if your toes tingle, become numb, or turn cold and blue. Keep it clean. If the air cast is not waterproof: Do not let it get wet. Cover it with a watertight covering when you take a bath or shower. Managing pain, stiffness, and swelling  If directed, put ice on the injured area. To do this: If you have a removable air cast, remove it as told by your health care provider. Put ice in a plastic bag. Place a towel between your skin and the bag. Leave the ice on for 20 minutes, 2-3 times a day. Move your toes often to reduce stiffness and swelling. Raise (elevate) your foot above the level of your heart while you are sitting or lying down.  Activity Gradually return to your normal activities as told by your health care provider. Ask your health care provider what activities are safe for you. Do not do activities that cause pain. Consider doing low-impact exercises, like cycling or swimming. Ask your health care provider when it is safe to drive if you have an air cast on your foot. If physical therapy was  prescribed, do exercises as told by your health care provider or physical therapist. General instructions If directed, wrap your foot with an elastic bandage or other wrap. This can help to keep your tendon from moving too  much while it heals. Your health care provider will show you how to wrap your foot correctly. Wear supportive shoes or heel lifts only as told by your health care provider. Take over-the-counter and prescription medicines only as told by your health care provider. Keep all follow-up visits as told by your health care provider. This is important. Contact a health care provider if you: Have symptoms that get worse. Have pain that does not get better with medicine. Develop new, unexplained symptoms. Develop warmth and swelling in your foot. Have a fever. Get help right away if you: Have a sudden popping sound or sensation in your Achilles tendon followed by severe pain. Cannot move your toes or foot. Cannot put any weight on your foot. Your foot or toes become numb and look white or blue even after loosening your bandage or air cast. Summary Achilles tendinitis is inflammation of the tough, cord-like band that attaches the lower leg muscles to the heel bone (Achilles tendon). This condition is usually caused by overusing the tendon and the ankle joint. It can also be caused by arthritis or normal aging. The most common symptoms of this condition include pain, swelling, or stiffness in the Achilles tendon or in the back of the leg. This condition is usually treated by decreasing or stopping activities that caused the tendinitis, icing the injured area, taking NSAIDs, and doing physical therapy. This information is not intended to replace advice given to you by your health care provider. Make sure you discuss any questions you have with your healthcare provider. Document Revised: 01/07/2019 Document Reviewed: 01/07/2019 Elsevier Patient Education  [place]  LOW-CHOLESTEROL, LOW-TRIGLYCERIDE DIETS    FOODS TO USE   MEATS, FISH Choose lean meats (chicken, [place], veal, and non-fatty cuts of beef with excess fat trimmed; one serving = 3 oz of cooked meat). Also, fresh or  frozen fish, canned fish packed in water, and shellfish (lobster, crabs, shrimp, and oysters). Limit use to no more than one serving of one of these per week. Shellfish are high in cholesterol but low in saturated fat and should be used sparingly. Meats and fish should be broiled (pan or oven) or baked on a rack.  EGGS Egg substitutes and egg whites (use freely). Egg yolks (limit two per week).  FRUITS Eat three servings of fresh fruit per day (1 serving =  cup). Be sure to have at least one citrus fruit daily. Frozen and canned fruit with no sugar or syrup added may be used.  VEGETABLES Most vegetables are not limited (see next page). One dark-green (string beans, escarole) or one deep yellow (squash) vegetable is recommended daily. Cauliflower, broccoli, and celery, as well as potato skins, are recommended for their fiber content. (Fiber is associated with cholesterol reduction) It is preferable to steam vegetables, but they may be boiled, strained, or braised with polyunsaturated vegetable oil (see below).  BEANS Dried peas or beans (1 serving =  cup) may be used as a bread substitute.  NUTS Almonds, walnuts, and peanuts may be used sparingly  (1 serving = 1 Tablespoonful). Use pumpkin, sesame, or sunflower seeds.  BREADS, GRAINS One roll or one slice of whole grain or enriched bread may be used, or three soda crackers or four pieces of  melba toast as a substitute. Spaghetti, rice or noodles ( cup) or  large ear of corn may be used as a bread substitute. In preparing these foods do not use butter or shortening, use soft margarine. Also use egg and sugar substitutes.  Choose high fiber grains, such as oats and whole wheat.  CEREALS Use  cup of hot cereal or  cup of cold cereal per day. Add a sugar substitute if desired, with 99% fat free or skim milk.  MILK PRODUCTS Always use 99% fat free or skim milk, dairy products such as low fat cheeses (farmer's uncreamed diet cottage), low-fat yogurt, and  powdered skim milk.  FATS, OILS Use soft (not stick) margarine; vegetable oils that are high in polyunsaturated fats (such as safflower, sunflower, soybean, corn, and cottonseed). Always refrigerate meat drippings to harden the fat and remove it before preparing gravies  DESSERTS, SNACKS Limit to two servings per day; substitute each serving for a bread/cereal serving: ice milk, water sherbet (1/4 cup); unflavored gelatin or gelatin flavored with sugar substitute (1/3 cup); pudding prepared with skim milk (1/2 cup); egg white souffls; unbuttered popcorn (1  cups). Substitute carob for chocolate.  BEVERAGES Fresh fruit juices (limit 4 oz per day); black coffee, plain or herbal teas; soft drinks with sugar substitutes; club soda, preferably salt-free; cocoa made with skim milk or nonfat dried milk and water (sugar substitute added if desired); clear broth. Alcohol: limit two servings per day (see second page).  MISCELLANEOUS  You may use the following freely: vinegar, spices, herbs, nonfat bouillon, mustard, Worcestershire sauce, soy sauce, flavoring essence.                  GUIDELINES FOR  LOW-CHOLESTEROL, LOW TRIGLYCERIDE DIETS    FOODS TO AVOID   MEATS, FISH Marbled beef, pork, bacon, sausage, and other pork products; fatty fowl (duck, goose); skin and fat of [place] and chicken; processed meats; luncheon meats (salami, bologna); frankfurters and fast-food hamburgers (theyre loaded with fat); organ meats (kidneys, liver); canned fish packed in oil.  EGGS Limit egg yolks to two per week.   FRUITS Coconuts (rich in saturated fats).  VEGETABLES Avoid avocados. Starchy vegetables (potatoes, corn, lima beans, dried peas, beans) may be used only if substitutes for a serving of bread or cereal. (Baked potato skin, however, is desirable for its fiber content.  BEANS Commercial baked beans with sugar and/or pork added.  NUTS Avoid nuts.  Limit peanuts and walnuts to one tablespoonful per  day.  BREADS, GRAINS Any baked goods with shortening and/or sugar. Commercial mixes with dried eggs and whole milk. Avoid sweet rolls, doughnuts, breakfast pastries (Danish), and sweetened packaged cereals (the added sugar converts readily to triglycerides).  MILK PRODUCTS Whole milk and whole-milk packaged goods; cream; ice cream; whole-milk puddings, yogurt, or cheeses; nondairy cream substitutes.  FATS, OILS Butter, lard, animal fats, bacon drippings, gravies, cream sauces as well as palm and coconut oils. All these are high in saturated fats. Examine labels on cholesterol free products for hydrogenated fats. (These are oils that have been hardened into solids and in the process have become saturated.)  DESSERTS, SNACKS Fried snack foods like potato chips; chocolate; candies in general; jams, jellies, syrups; whole- milk puddings; ice cream and milk sherbets; hydrogenated peanut butter.  BEVERAGES Sugared fruit juices and soft drinks; cocoa made with whole milk and/or sugar. When using alcohol (1 oz liquor, 5 oz beer, or 2  oz dry table wine per serving), one serving must be  substituted for one bread or cereal serving (limit, two servings of alcohol per day).   SPECIAL NOTES    Remember that even non-limited foods should be used in moderation. While on a cholesterol-lowering diet, be sure to avoid animal fats and marbled meats. 3. While on a triglyceride-lowering diet, be sure to avoid sweets and to control the amount of carbohydrates you eat (starchy foods such as flour, bread, potatoes).While on a tri-glyceride-lowering diet, be sure to avoid sweets Buy a good low-fat cookbook, such as the one published by the American Heart Association. Consult your physician if you have any questions.               [facility] Low Glycemic Diet Plan   Low Glycemic Foods (20-49) Moderate Glycemic Foods (50-69) High Glycemic Foods (70-100)      Breakfast Creals Breakfast Cereals Breakfast  Cereals  All Bran All-Bran Fruit'n Oats   Bran Buds Bran Chex   Cheerios Corn chex    Fiber One Oatmeal (not instant)   Just Right Mini-Wheats   Corn Flakes Cream of Wheat    Oat Bran Special K Swiss Muesli   Grape Nuts Grape Nut Flakes      Grits Nutri-Grain    Fruits and fruit juice: Fruits Puffed Rice Puffed Wheat    (Limit to 1-2 Servings per day) Banana (under-ride) Dates   Rice Chex Rice Krispies    Apples Apricots (fresh/dried)   Figs Grapes   Shredded Wheat Team    Blackberries Blueberries   Kiwi Mango   Total     Cherries Cranberries   Oranges Raisins     Peaches Pears    Fruits  Plums Prunes   Fruit Juices Pineapple Watermelon    Grapefruit Raspberries   Cranberry Juice Orange Juice   Banana (over-ripe)     Strawberries Tangerines      Apple Juice Grapefruit Juice   Beans and Legumes Beverages  Tomato Juice    [place]-type baked beans Sodas, sweet tea, pineapple juice   Canned pinto, kidney, or navy beans   Beans and Legumes (fresh-cooked) Green peas Vegetables  Black-eyed peas Butter Beans    Potato, baked, boiled, fried, mashed  Chick peas Lentils   Vegetables French fries  Green beans Lima beans   Beets Carrots   Canned or frozen corn  Kidney beans Navy beans   Sweet potato Yam   Parsnips  Pinto beans Snow peas   Corn on the cob Winter squash      Non-starchy vegetables Grains Breads  Asparagus, avocado, broccoli, cabbage Cornmeal Rice, brown   Most breads (white and whole grain)  cauliflower, celery, cucumber, greens Rice, white Couscous   Bagels Bread sticks    lettuce, mushrooms, peppers, tomatoes  Bread stuffing Kaiser roll    okra, onions, spinach, summer squash Pasta Dinner rolls   [employer], cheese     Grains Ravioli, meat filled Spaghetti, white   Grains  Barley Bulgur    Rice, instant Tapioca, with milk    Rye Wild rice   Nuts    Cashews Macadamia   Candy and most cookies  Nuts and oils    Almonds, peanuts,  sunflower seeds Snacks Snacks  hazelnuts, pecans, walnuts Chocolate Ice cream, lowfat   Donuts Corn chips    Oils that are liquid at room temperature Muffin Popcorn   Jelly beans Pretzels      Pastries  Dairy, fish, meat, soy, and eggs    Milk, skim Lowfat  cheese    Restaurant and ethnic foods  Yogurt, lowfat, fruit sugar sweetened  Most [place] food (sugar in stir fry    or wok sauce)  Lean red meat Fish    Teriyaki-style meats and vegetables  Skinless chicken and [place], shellfish        Egg whites (up to 3 daily), Soy Products    Egg yolks (up to 7 or _____ per week)

## 2021-03-11 NOTE — Progress Notes (Signed)
Date:  03/11/2021   Name:  Eugene Kramer   DOB:  1955/10/07   MRN:  [id number]   Chief Complaint: Annual Exam  Patient is a 65 year old male who presents for a comprehensive physical exam. The patient reports the following problems: heel/. Health maintenance has been reviewed need colonoscopy.     Lab Results  Component Value Date   CREATININE 1.05 03/10/2020   BUN 9 03/10/2020   NA 141 03/10/2020   K 4.2 03/10/2020   CL 102 03/10/2020   CO2 26 03/10/2020   Lab Results  Component Value Date   CHOL 214 (H) 04/23/2020   HDL 41 04/23/2020   LDLCALC 150 (H) 04/23/2020   TRIG 125 04/23/2020   CHOLHDL 5.2 (H) 04/23/2020   No results found for: TSH No results found for: HGBA1C Lab Results  Component Value Date   WBC 7.0 03/10/2020   HGB 14.9 03/10/2020   HCT 44.5 03/10/2020   MCV 90 03/10/2020   PLT 180 03/10/2020   Lab Results  Component Value Date   ALT 19 03/10/2020   AST 22 03/10/2020   ALKPHOS 100 03/10/2020   BILITOT 0.5 03/10/2020     Review of Systems  Constitutional:  Negative for chills and fever.  HENT:  Negative for drooling, ear discharge, ear pain and sore throat.   Respiratory:  Positive for shortness of breath and wheezing. Negative for cough.   Cardiovascular:  Negative for chest pain, palpitations and leg swelling.  Gastrointestinal:  Negative for abdominal pain, blood in stool, constipation, diarrhea and nausea.  Endocrine: Negative for polydipsia.  Genitourinary:  Negative for dysuria, frequency, hematuria and urgency.  Musculoskeletal:  Negative for back pain, myalgias and neck pain.  Skin:  Negative for rash.  Allergic/Immunologic: Negative for environmental allergies.  Neurological:  Negative for dizziness and headaches.  Hematological:  Does not bruise/bleed easily.  Psychiatric/Behavioral:  Negative for suicidal ideas. The patient is not nervous/anxious.    There are no problems to display for this patient.   Allergies   Allergen Reactions   Erythromycin Swelling    Past Surgical History:  Procedure Laterality Date   COLONOSCOPY  10/25/2014   Dr Allen Norris   HERNIA REPAIR     TONSILLECTOMY AND ADENOIDECTOMY      Social History   Tobacco Use   Smoking status: Former    Pack years: 0.00   Smokeless tobacco: Current    Types: Snuff  Substance Use Topics   Alcohol use: Yes    Alcohol/week: 0.0 standard drinks   Drug use: No     Medication list has been reviewed and updated.  Current Meds  Medication Sig   APPLE CIDER VINEGAR PO Take 1 ampule by mouth daily.   budesonide-formoterol (SYMBICORT) 80-4.5 MCG/ACT inhaler Inhale 2 puffs into the lungs every morning.   Cinnamon 500 MG TABS Take 1 tablet by mouth daily.   fluticasone (FLONASE) 50 MCG/ACT nasal spray Place 1 spray into both nostrils daily.   Garlic 580 MG CAPS Take 1 capsule by mouth daily.   Misc Natural Products (PROSTATE HEALTH) CAPS Take 1 capsule by mouth daily.   Omega-3 Fatty Acids (FISH OIL) 1000 MG CAPS Take 1 capsule by mouth daily.   Red Yeast Rice Extract (RED YEAST RICE PO) Take by mouth.   tadalafil (CIALIS) 5 MG tablet Take 1 tablet (5 mg total) by mouth daily as needed for erectile dysfunction.   UNABLE TO FIND 1 capsule daily. Med Name:  gooseberry   [DISCONTINUED] aspirin 81 MG tablet Take 81 mg by mouth daily.   [DISCONTINUED] UNABLE TO FIND daily. Med Name: CBD Oil   Current Facility-Administered Medications for the 03/11/21 encounter (Office Visit) with Juline Patch, MD  Medication   albuterol (PROVENTIL) (2.5 MG/3ML) 0.083% nebulizer solution 2.5 mg    PHQ 2/9 Scores 03/11/2021 03/10/2020 06/24/2019 04/06/2017  PHQ - 2 Score 0 0 0 1  PHQ- 9 Score 0 0 - 3    GAD 7 : Generalized Anxiety Score 03/11/2021 03/10/2020  Nervous, Anxious, on Edge 0 1  Control/stop worrying 0 0  Worry too much - different things 0 1  Trouble relaxing 0 0  Restless 0 0  Easily annoyed or irritable 0 0  Afraid - awful might happen 0 0   Total GAD 7 Score 0 2  Anxiety Difficulty - Not difficult at all    BP Readings from Last 3 Encounters:  03/11/21 120/80  03/10/20 130/80  10/02/19 128/78    Physical Exam Vitals and nursing note reviewed.  Constitutional:      Appearance: Normal appearance. He is well-groomed and normal weight.  HENT:     Head: Normocephalic.     Jaw: There is normal jaw occlusion.     Right Ear: Hearing, tympanic membrane, ear canal and external ear normal.     Left Ear: Hearing, tympanic membrane, ear canal and external ear normal.     Nose: Nose normal.     Mouth/Throat:     Lips: Pink.     Mouth: Mucous membranes are moist.     Dentition: Normal dentition.     Pharynx: Oropharynx is clear. Uvula midline. No pharyngeal swelling or oropharyngeal exudate.     Tonsils: No tonsillar exudate or tonsillar abscesses.  Eyes:     General: Lids are normal. Vision grossly intact. Gaze aligned appropriately. No scleral icterus.       Right eye: No discharge.        Left eye: No discharge.     Extraocular Movements: Extraocular movements intact.     Conjunctiva/sclera: Conjunctivae normal.     Pupils: Pupils are equal, round, and reactive to light.     Funduscopic exam:    Right eye: Red reflex present.        Left eye: Red reflex present. Neck:     Thyroid: No thyroid mass, thyromegaly or thyroid tenderness.     Vascular: Normal carotid pulses. No carotid bruit, hepatojugular reflux or JVD.     Trachea: Trachea and phonation normal. No tracheal deviation.  Cardiovascular:     Rate and Rhythm: Normal rate and regular rhythm.     Chest Wall: PMI is not displaced.     Pulses: Normal pulses.          Carotid pulses are 2+ on the right side and 2+ on the left side.      Radial pulses are 2+ on the right side and 2+ on the left side.       Femoral pulses are 2+ on the right side and 2+ on the left side.      Popliteal pulses are 2+ on the right side and 2+ on the left side.       Dorsalis pedis  pulses are 2+ on the right side and 2+ on the left side.       Posterior tibial pulses are 2+ on the right side and 2+ on the left side.  Heart sounds: Normal heart sounds, S1 normal and S2 normal. No murmur heard. No systolic murmur is present.  No diastolic murmur is present.    No friction rub. No gallop. No S3 or S4 sounds.  Pulmonary:     Effort: Pulmonary effort is normal. No respiratory distress.     Breath sounds: Normal breath sounds. No decreased breath sounds, wheezing, rhonchi or rales.  Chest:  Breasts:    Breasts are symmetrical.     Right: Normal. No swelling, bleeding, inverted nipple, axillary adenopathy or supraclavicular adenopathy.     Left: Normal. No swelling, bleeding, inverted nipple, axillary adenopathy or supraclavicular adenopathy.  Abdominal:     General: Bowel sounds are normal.     Palpations: Abdomen is soft. There is no hepatomegaly, splenomegaly or mass.     Tenderness: There is no abdominal tenderness. There is no guarding or rebound.     Hernia: There is no hernia in the umbilical area, ventral area, left inguinal area or right inguinal area.  Genitourinary:    Penis: Uncircumcised.      Testes: Normal.        Right: Mass not present.        Left: Mass not present.     Epididymis:     Right: Normal.     Left: Normal.     Comments: Refused by patient Musculoskeletal:        General: No tenderness. Normal range of motion.     Cervical back: Full passive range of motion without pain, normal range of motion and neck supple.     Right lower leg: No edema.     Left lower leg: No edema.  Lymphadenopathy:     Cervical: No cervical adenopathy.     Right cervical: No superficial, deep or posterior cervical adenopathy.    Left cervical: No superficial, deep or posterior cervical adenopathy.     Upper Body:     Right upper body: No supraclavicular, axillary or pectoral adenopathy.     Left upper body: No supraclavicular, axillary or pectoral  adenopathy.     Lower Body: No right inguinal adenopathy. No left inguinal adenopathy.  Skin:    General: Skin is warm.     Capillary Refill: Capillary refill takes less than 2 seconds.     Findings: No rash.  Neurological:     Mental Status: He is alert and oriented to person, place, and time.     Cranial Nerves: Cranial nerves are intact. No cranial nerve deficit.     Sensory: Sensation is intact.     Motor: Motor function is intact.     Coordination: Coordination is intact.     Deep Tendon Reflexes: Reflexes are normal and symmetric.  Psychiatric:        Behavior: Behavior is cooperative.       Wt Readings from Last 3 Encounters:  03/11/21 203 lb (92.1 kg)  03/10/20 207 lb (93.9 kg)  10/02/19 211 lb (95.7 kg)    BP 120/80   Pulse 68   Ht 6' (1.829 m)   Wt 203 lb (92.1 kg)   BMI 27.53 kg/m   Assessment and Plan: Eugene Kramer is a 65 y.o. male who presents today for his Complete Annual Exam. He feels well. He reports exercising by working physically.Marland Kitchen He reports he is sleeping fairly well.  Patient's chart was reviewed for previous encounters as well as most recent labs most recent imaging and care everywhere.  .1. Annual physical exam  Eugene Kramer is a 65 y.o. male who presents today for his Complete Annual Exam. He feels well. He reports exercising physical work. He reports he is sleeping fairly well.  Immunizations are reviewed and recommendations provided.   Age appropriate screening tests are discussed. Counseling given for risk factor reduction interventions.  No subjective-objective concerns noted during history and physical exam.  We will check baseline labs including CMP, lipid panel, and CBC. - Comprehensive metabolic panel - Lipid Panel With LDL/HDL Ratio - CBC with Differential/Platelet  2. Seasonal allergic rhinitis due to pollen Chronic.  Controlled.  Stable.  We will continue Flonase as needed. - fluticasone (FLONASE) 50 MCG/ACT nasal spray;  Place 1 spray into both nostrils daily.  Dispense: 16 g; Refill: 11  3. Mild intermittent asthma without complication Chronic.  Controlled.  Stable.  Continue Symbicort once a day with rescue inhalation particularly on hot day and days of pollen count. - budesonide-formoterol (SYMBICORT) 80-4.5 MCG/ACT inhaler; Inhale 2 puffs into the lungs every morning.  Dispense: 1 each; Refill: 12 - albuterol (VENTOLIN HFA) 108 (90 Base) MCG/ACT inhaler; Inhale 2 puffs into the lungs every 6 (six) hours as needed for wheezing or shortness of breath.  Dispense: 8 g; Refill: 6  4. Mixed hyperlipidemia Patient has a history of elevated cholesterol which she controls with diet alone.  We will check lipid panel for current status.  5. Achilles tendinitis, unspecified laterality Patient relates that he has tenderness and discomfort in the Achilles tendon at the insertion.  There is no palpable defect patient has been instructed to get a heel cup and did do calf stretching exercises.

## 2021-03-12 LAB — CBC WITH DIFFERENTIAL/PLATELET
Basophils Absolute: 0.1 10*3/uL (ref 0.0–0.2)
Basos: 1 %
EOS (ABSOLUTE): 0.9 10*3/uL — ABNORMAL HIGH (ref 0.0–0.4)
Eos: 11 %
Hematocrit: 45.3 % (ref 37.5–51.0)
Hemoglobin: 15.9 g/dL (ref 13.0–17.7)
Immature Grans (Abs): 0 10*3/uL (ref 0.0–0.1)
Immature Granulocytes: 0 %
Lymphocytes Absolute: 2.2 10*3/uL (ref 0.7–3.1)
Lymphs: 29 %
MCH: 31.3 pg (ref 26.6–33.0)
MCHC: 35.1 g/dL (ref 31.5–35.7)
MCV: 89 fL (ref 79–97)
Monocytes Absolute: 0.6 10*3/uL (ref 0.1–0.9)
Monocytes: 8 %
Neutrophils Absolute: 3.8 10*3/uL (ref 1.4–7.0)
Neutrophils: 51 %
Platelets: 178 10*3/uL (ref 150–450)
RBC: 5.08 x10E6/uL (ref 4.14–5.80)
RDW: 12.3 % (ref 11.6–15.4)
WBC: 7.6 10*3/uL (ref 3.4–10.8)

## 2021-03-12 LAB — COMPREHENSIVE METABOLIC PANEL
ALT: 22 IU/L (ref 0–44)
AST: 25 IU/L (ref 0–40)
Albumin/Globulin Ratio: 2.4 — ABNORMAL HIGH (ref 1.2–2.2)
Albumin: 4.8 g/dL (ref 3.8–4.8)
Alkaline Phosphatase: 102 IU/L (ref 44–121)
BUN/Creatinine Ratio: 13 (ref 10–24)
BUN: 10 mg/dL (ref 8–27)
Bilirubin Total: 0.6 mg/dL (ref 0.0–1.2)
CO2: 25 mmol/L (ref 20–29)
Calcium: 9.4 mg/dL (ref 8.6–10.2)
Chloride: 103 mmol/L (ref 96–106)
Creatinine, Ser: 0.77 mg/dL (ref 0.76–1.27)
Globulin, Total: 2 g/dL (ref 1.5–4.5)
Glucose: 85 mg/dL (ref 65–99)
Potassium: 5 mmol/L (ref 3.5–5.2)
Sodium: 140 mmol/L (ref 134–144)
Total Protein: 6.8 g/dL (ref 6.0–8.5)
eGFR: 101 mL/min/{1.73_m2} (ref >59)

## 2021-03-12 LAB — LIPID PANEL WITH LDL/HDL RATIO
Cholesterol, Total: 204 mg/dL — ABNORMAL HIGH (ref 100–199)
HDL: 37 mg/dL — ABNORMAL LOW (ref >39)
LDL Chol Calc (NIH): 136 mg/dL — ABNORMAL HIGH (ref 0–99)
LDL/HDL Ratio: 3.7 ratio — ABNORMAL HIGH (ref 0.0–3.6)
Triglycerides: 171 mg/dL — ABNORMAL HIGH (ref 0–149)
VLDL Cholesterol Cal: 31 mg/dL (ref 5–40)

## 2021-03-15 ENCOUNTER — Ambulatory Visit (INDEPENDENT_AMBULATORY_CARE_PROVIDER_SITE_OTHER): Payer: BC Managed Care – PPO

## 2021-03-15 ENCOUNTER — Encounter: Payer: Self-pay | Admitting: Family Medicine

## 2021-03-15 DIAGNOSIS — Z0000 Encounter for general adult medical examination without abnormal findings: Secondary | ICD-10-CM | POA: Diagnosis not present

## 2021-03-15 LAB — HEMOCCULT GUIAC POC 1CARD (OFFICE)
Card #2 Fecal Occult Blod, POC: NEGATIVE
Card #3 Date: NEGATIVE
Fecal Occult Blood, POC: NEGATIVE

## 2021-03-16 LAB — SPECIMEN STATUS REPORT

## 2021-03-16 LAB — PSA: Prostate Specific Ag, Serum: 0.7 ng/mL (ref 0.0–4.0)

## 2021-04-01 ENCOUNTER — Telehealth (INDEPENDENT_AMBULATORY_CARE_PROVIDER_SITE_OTHER): Payer: Self-pay | Admitting: Gastroenterology

## 2021-04-01 DIAGNOSIS — Z86010 Personal history of colonic polyps: Secondary | ICD-10-CM

## 2021-04-01 DIAGNOSIS — Z800 Family history of malignant neoplasm of digestive organs: Secondary | ICD-10-CM

## 2021-04-01 MED ORDER — CLENPIQ 10-3.5-12 MG-GM -GM/160ML PO SOLN: 1.0000 | Freq: Once | ORAL | 0 refills | Status: AC

## 2021-04-01 NOTE — Progress Notes (Signed)
Gastroenterology Pre-Procedure Review  Request Date: 04/30/21 Requesting Physician: Dr. Allen Norris  PATIENT REVIEW QUESTIONS: The patient responded to the following health history questions as indicated:    1. Are you having any GI issues? no 2. Do you have a personal history of Polyps? yes (11/03/2015) 3. Do you have a family history of Colon Cancer or Polyps? yes (father colon cancer) 4. Diabetes Mellitus? no 5. Joint replacements in the past 12 months?no 6. Major health problems in the past 3 months?no 7. Any artificial heart valves, MVP, or defibrillator?no    MEDICATIONS & ALLERGIES:    Patient reports the following regarding taking any anticoagulation/antiplatelet therapy:   Plavix, Coumadin, Eliquis, Xarelto, Lovenox, Pradaxa, Brilinta, or Effient? no Aspirin? no  Patient confirms/reports the following medications:  Current Outpatient Medications  Medication Sig Dispense Refill   albuterol (VENTOLIN HFA) 108 (90 Base) MCG/ACT inhaler Inhale 2 puffs into the lungs every 6 (six) hours as needed for wheezing or shortness of breath. 8 g 6   APPLE CIDER VINEGAR PO Take 1 ampule by mouth daily.     budesonide-formoterol (SYMBICORT) 80-4.5 MCG/ACT inhaler Inhale 2 puffs into the lungs every morning. 1 each 12   Cinnamon 500 MG TABS Take 1 tablet by mouth daily.     fluticasone (FLONASE) 50 MCG/ACT nasal spray Place 1 spray into both nostrils daily. 16 g 11   Garlic [id number] MG CAPS Take 1 capsule by mouth daily.     ipratropium-albuterol (DUONEB) 0.5-2.5 (3) MG/3ML SOLN Take 3 mLs by nebulization every 6 (six) hours as needed. (Patient not taking: Reported on 03/11/2021) 360 mL 3   Misc Natural Products (PROSTATE HEALTH) CAPS Take 1 capsule by mouth daily.     Omega-3 Fatty Acids (FISH OIL) 1000 MG CAPS Take 1 capsule by mouth daily.     pantoprazole (PROTONIX) 40 MG tablet Take 1 tablet (40 mg total) by mouth daily. (Patient not taking: Reported on 03/11/2021) 30 tablet 6   Red Yeast Rice Extract  (RED YEAST RICE PO) Take by mouth.     tadalafil (CIALIS) 5 MG tablet Take 1 tablet (5 mg total) by mouth daily as needed for erectile dysfunction. 30 tablet 11   UNABLE TO FIND 1 capsule daily. Med Name: gooseberry     Current Facility-Administered Medications  Medication Dose Route Frequency Provider Last Rate Last Admin   albuterol (PROVENTIL) (2.5 MG/3ML) 0.083% nebulizer solution 2.5 mg  2.5 mg Nebulization Once Juline Patch, MD        Patient confirms/reports the following allergies:  Allergies  Allergen Reactions   Erythromycin Swelling    No orders of the defined types were placed in this encounter.   AUTHORIZATION INFORMATION Primary Insurance: 1D#: Group #:  Secondary Insurance: 1D#: Group #:  SCHEDULE INFORMATION: Date: 04/30/21 Time: Location: [place]

## 2021-04-21 ENCOUNTER — Encounter: Payer: Self-pay | Admitting: Gastroenterology

## 2021-04-30 ENCOUNTER — Other Ambulatory Visit: Payer: Self-pay

## 2021-04-30 ENCOUNTER — Encounter
Admission: RE | Disposition: A | Discharge status: Home / Self Care | Payer: Self-pay | Source: Home / Self Care | Attending: Gastroenterology

## 2021-04-30 ENCOUNTER — Ambulatory Visit: Payer: BC Managed Care – PPO | Admitting: Anesthesiology

## 2021-04-30 ENCOUNTER — Encounter: Payer: Self-pay | Admitting: Gastroenterology

## 2021-04-30 ENCOUNTER — Encounter: Payer: Self-pay | Admitting: Family Medicine

## 2021-04-30 ENCOUNTER — Ambulatory Visit
Admission: RE | Admit: 2021-04-30 | Discharge: 2021-04-30 | Disposition: A | Discharge status: Home / Self Care | Payer: BC Managed Care – PPO | Attending: Gastroenterology | Admitting: Gastroenterology

## 2021-04-30 DIAGNOSIS — Z1211 Encounter for screening for malignant neoplasm of colon: Secondary | ICD-10-CM | POA: Insufficient documentation

## 2021-04-30 DIAGNOSIS — D123 Benign neoplasm of transverse colon: Secondary | ICD-10-CM | POA: Diagnosis not present

## 2021-04-30 DIAGNOSIS — Z86010 Personal history of colonic polyps: Secondary | ICD-10-CM

## 2021-04-30 DIAGNOSIS — Z7951 Long term (current) use of inhaled steroids: Secondary | ICD-10-CM | POA: Insufficient documentation

## 2021-04-30 DIAGNOSIS — K5730 Diverticulosis of large intestine without perforation or abscess without bleeding: Secondary | ICD-10-CM | POA: Diagnosis not present

## 2021-04-30 DIAGNOSIS — Z8719 Personal history of other diseases of the digestive system: Secondary | ICD-10-CM | POA: Diagnosis not present

## 2021-04-30 DIAGNOSIS — E782 Mixed hyperlipidemia: Secondary | ICD-10-CM

## 2021-04-30 DIAGNOSIS — Z881 Allergy status to other antibiotic agents status: Secondary | ICD-10-CM | POA: Diagnosis not present

## 2021-04-30 DIAGNOSIS — K648 Other hemorrhoids: Secondary | ICD-10-CM | POA: Diagnosis not present

## 2021-04-30 DIAGNOSIS — Z79899 Other long term (current) drug therapy: Secondary | ICD-10-CM | POA: Diagnosis not present

## 2021-04-30 HISTORY — DX: Motion sickness, initial encounter: T75.3XXA

## 2021-04-30 HISTORY — PX: COLONOSCOPY WITH PROPOFOL: SHX5780

## 2021-04-30 HISTORY — PX: POLYPECTOMY: SHX5525

## 2021-04-30 LAB — HM COLONOSCOPY

## 2021-04-30 SURGERY — COLONOSCOPY WITH PROPOFOL
Anesthesia: General | Site: Rectum

## 2021-04-30 MED ORDER — SODIUM CHLORIDE 0.9 % IV SOLN: INTRAVENOUS | Status: DC

## 2021-04-30 MED ORDER — STERILE WATER FOR IRRIGATION IR SOLN
Status: DC | PRN
  Administered 2021-04-30: .05 mL

## 2021-04-30 MED ORDER — LACTATED RINGERS IV SOLN: INTRAVENOUS | Status: DC

## 2021-04-30 MED ORDER — PROPOFOL 10 MG/ML IV BOLUS
INTRAVENOUS | Status: DC | PRN
  Administered 2021-04-30: 180 mg via INTRAVENOUS
  Administered 2021-04-30 (×2): 30 mg via INTRAVENOUS

## 2021-04-30 MED ORDER — LIDOCAINE HCL (CARDIAC) PF 100 MG/5ML IV SOSY
PREFILLED_SYRINGE | INTRAVENOUS | Status: DC | PRN
  Administered 2021-04-30: 30 mg via INTRAVENOUS

## 2021-04-30 SURGICAL SUPPLY — 9 items
FORCEPS BIOP RAD 4 LRG CAP 4 (CUTTING FORCEPS) ×3 IMPLANT
GOWN CVR UNV OPN BCK APRN NK (MISCELLANEOUS) ×4 IMPLANT
GOWN ISOL THUMB LOOP REG UNIV (MISCELLANEOUS) ×6
KIT PRC NS LF DISP ENDO (KITS) ×2 IMPLANT
KIT PROCEDURE OLYMPUS (KITS) ×3
MANIFOLD NEPTUNE II (INSTRUMENTS) ×3 IMPLANT
SNARE COLD EXACTO (MISCELLANEOUS) IMPLANT
TRAP ETRAP POLY (MISCELLANEOUS) IMPLANT
WATER STERILE IRR 250ML POUR (IV SOLUTION) ×3 IMPLANT

## 2021-04-30 NOTE — Anesthesia Postprocedure Evaluation (Signed)
Anesthesia Post Note  Patient: Eugene Kramer  Procedure(s) Performed: COLONOSCOPY WITH PROPOFOL (Rectum) POLYPECTOMY (Rectum)     Patient location during evaluation: PACU Anesthesia Type: General Level of consciousness: awake and alert Pain management: pain level controlled Vital Signs Assessment: post-procedure vital signs reviewed and stable Respiratory status: spontaneous breathing, nonlabored ventilation, respiratory function stable and patient connected to nasal cannula oxygen Cardiovascular status: blood pressure returned to baseline and stable Postop Assessment: no apparent nausea or vomiting Anesthetic complications: no   No notable events documented.  Sinda Du

## 2021-04-30 NOTE — Anesthesia Preprocedure Evaluation (Signed)
Anesthesia Evaluation  Patient identified by MRN, date of birth, ID band Patient awake    Reviewed: Allergy & Precautions, NPO status , Patient's Chart, lab work & pertinent test results  Airway Mallampati: II  TM Distance: >3 FB Neck ROM: Full    Dental no notable dental hx.    Pulmonary asthma , Patient abstained from smoking., former smoker,    Pulmonary exam normal breath sounds clear to auscultation       Cardiovascular negative cardio ROS Normal cardiovascular exam Rhythm:Regular Rate:Normal     Neuro/Psych negative neurological ROS  negative psych ROS   GI/Hepatic negative GI ROS, Neg liver ROS,   Endo/Other  negative endocrine ROS  Renal/GU negative Renal ROS  negative genitourinary   Musculoskeletal negative musculoskeletal ROS (+)   Abdominal Normal abdominal exam  (+) - obese,  Abdomen: soft.    Peds negative pediatric ROS (+)  Hematology negative hematology ROS (+)   Anesthesia Other Findings   Reproductive/Obstetrics negative OB ROS                             Anesthesia Physical Anesthesia Plan  ASA: 2  Anesthesia Plan: General   Post-op Pain Management:    Induction:   PONV Risk Score and Plan: 2 and Treatment may vary due to age or medical condition and Propofol infusion  Airway Management Planned: Nasal Cannula and Natural Airway  Additional Equipment:   Intra-op Plan:   Post-operative Plan:   Informed Consent: I have reviewed the patients History and Physical, chart, labs and discussed the procedure including the risks, benefits and alternatives for the proposed anesthesia with the patient or authorized representative who has indicated his/her understanding and acceptance.     Dental advisory given  Plan Discussed with: CRNA and Anesthesiologist  Anesthesia Plan Comments:         Anesthesia Quick Evaluation  There are no problems to  display for this patient.   CBC Latest Ref Rng & Units 03/11/2021 03/10/2020 06/24/2019  WBC 3.4 - 10.8 x10E3/uL 7.6 7.0 13.1(H)  Hemoglobin 13.0 - 17.7 g/dL 15.9 14.9 15.5  Hematocrit 37.5 - 51.0 % 45.3 44.5 46.1  Platelets 150 - 450 x10E3/uL 178 180 189   BMP Latest Ref Rng & Units 03/11/2021 03/10/2020 02/26/2019  Glucose 65 - 99 mg/dL 85 83 86  BUN 8 - 27 mg/dL '10 9 10  '$ Creatinine 0.76 - 1.27 mg/dL 0.77 1.05 0.99  BUN/Creat Ratio 10 - 24 13 9(L) 10  Sodium 134 - 144 mmol/L 140 141 141  Potassium 3.5 - 5.2 mmol/L 5.0 4.2 4.4  Chloride 96 - 106 mmol/L 103 102 102  CO2 20 - 29 mmol/L '25 26 25  '$ Calcium 8.6 - 10.2 mg/dL 9.4 9.5 9.4    Risks and benefits of anesthesia discussed at length, patient or surrogate demonstrates understanding. Appropriately NPO. Plan to proceed with anesthesia.  Champ Mungo, MD 04/30/21

## 2021-04-30 NOTE — H&P (Signed)
[name], MD [address] [place], [place] [address] [phone] Fax : [phone]  Primary Care Physician:  Juline Patch, MD Primary Gastroenterologist:  Dr. Allen Norris  Pre-Procedure History & Physical: HPI:  Eugene Kramer is a 65 y.o. male is here for an colonoscopy.   Past Medical History:  Diagnosis Date   Asthma    Motion sickness    deep sea fishing    Past Surgical History:  Procedure Laterality Date   COLONOSCOPY  10/25/2014   Dr Allen Norris   HERNIA REPAIR     TONSILLECTOMY AND ADENOIDECTOMY      Prior to Admission medications   Medication Sig Start Date End Date Taking? Authorizing Provider  albuterol (VENTOLIN HFA) 108 (90 Base) MCG/ACT inhaler Inhale 2 puffs into the lungs every 6 (six) hours as needed for wheezing or shortness of breath. 03/11/21  Yes Juline Patch, MD  APPLE CIDER VINEGAR PO Take 1 ampule by mouth daily.   Yes [provider]  budesonide-formoterol (SYMBICORT) 80-4.5 MCG/ACT inhaler Inhale 2 puffs into the lungs every morning. 03/11/21  Yes Juline Patch, MD  Cinnamon 500 MG TABS Take 1 tablet by mouth daily.   Yes [provider]  fluticasone (FLONASE) 50 MCG/ACT nasal spray Place 1 spray into both nostrils daily. 03/11/21  Yes Juline Patch, MD  Garlic [id number] MG CAPS Take 1 capsule by mouth daily.   Yes [provider]  Misc Natural Products (PROSTATE HEALTH) CAPS Take 1 capsule by mouth daily.   Yes [provider]  Omega-3 Fatty Acids (FISH OIL) 1000 MG CAPS Take 1 capsule by mouth daily.   Yes [provider]  Red Yeast Rice Extract (RED YEAST RICE PO) Take by mouth.   Yes [provider]  tadalafil (CIALIS) 5 MG tablet Take 1 tablet (5 mg total) by mouth daily as needed for erectile dysfunction. 09/24/19  Yes Billey Co, MD  ipratropium-albuterol (DUONEB) 0.5-2.5 (3) MG/3ML SOLN Take 3 mLs by nebulization every 6 (six) hours as needed. Patient not taking:  Reported on 04/21/2021 09/16/16   Juline Patch, MD  pantoprazole (PROTONIX) 40 MG tablet Take 1 tablet (40 mg total) by mouth daily. Patient not taking: No sig reported 10/02/19   [name], MD  UNABLE TO FIND 1 capsule daily. Med Name: gooseberry Patient not taking: Reported on 04/21/2021    [provider]    Allergies as of 04/01/2021 - Review Complete 03/11/2021  Allergen Reaction Noted   Erythromycin Swelling 12/10/2015    History reviewed. No pertinent family history.  Social History   Socioeconomic History   Marital status: Married    Spouse name: Not on file   Number of children: Not on file   Years of education: Not on file   Highest education level: Not on file  Occupational History   Not on file  Tobacco Use   Smoking status: Former    Types: Cigarettes    Quit date: 81    Years since quitting: 35.6   Smokeless tobacco: Current    Types: Snuff  Vaping Use   Vaping Use: Never used  Substance and Sexual Activity   Alcohol use: Yes    Comment: rare   Drug use: No   Sexual activity: Yes  Other Topics Concern   Not on file  Social History Narrative   Not on file   Social Determinants of Health   Financial Resource Strain: Not on file  Food Insecurity: Not on file  Transportation Needs: Not on file  Physical Activity: Not on file  Stress: Not on file  Social Connections: Not on file  Intimate Partner Violence: Not on file    Review of Systems: See HPI, otherwise negative ROS  Physical Exam: BP 124/79   Pulse (!) 57   Temp (!) 97.4 F (36.3 C) (Temporal)   Resp 16   Ht 6' (1.829 m)   Wt 89.8 kg   SpO2 98%   BMI 26.85 kg/m  General:   Alert,  pleasant and cooperative in NAD Head:  Normocephalic and atraumatic. Neck:  Supple; no masses or thyromegaly. Lungs:  Clear throughout to auscultation.    Heart:  Regular rate and rhythm. Abdomen:  Soft, nontender and nondistended. Normal bowel sounds, without guarding, and without rebound.    Neurologic:  Alert and  oriented x4;  grossly normal neurologically.  Impression/Plan: Eugene Kramer is here for an colonoscopy to be performed for a history of adenomatous polyps on 2017   Risks, benefits, limitations, and alternatives regarding  colonoscopy have been reviewed with the patient.  Questions have been answered.  All parties agreeable.   [name], MD  04/30/2021, 8:42 AM

## 2021-04-30 NOTE — Op Note (Signed)
[facility] Patient Name: Eugene Kramer Procedure Date: 04/30/2021 8:43 AM MRN: [id number] Account #: [id number] Date of Birth: [birth year] Admit Type: Outpatient Age: 65 Room: [facility] OR ROOM 01 Gender: Male Note Status: Finalized Procedure:             Colonoscopy Indications:           High risk colon cancer surveillance: Personal history                         of colonic polyps Providers:             [name] MD, MD Referring MD:          Juline Patch, MD (Referring MD) Medicines:             Propofol per Anesthesia Complications:         No immediate complications. Procedure:             Pre-Anesthesia Assessment:                        - Prior to the procedure, a History and Physical was                         performed, and patient medications and allergies were                         reviewed. The patient's tolerance of previous                         anesthesia was also reviewed. The risks and benefits                         of the procedure and the sedation options and risks                         were discussed with the patient. All questions were                         answered, and informed consent was obtained. Prior                         Anticoagulants: The patient has taken no previous                         anticoagulant or antiplatelet agents. ASA Grade                         Assessment: II - A patient with mild systemic disease.                         After reviewing the risks and benefits, the patient                         was deemed in satisfactory condition to undergo the                         procedure.  After obtaining informed consent, the colonoscope was                         passed under direct vision. Throughout the procedure,                         the patient's blood pressure, pulse, and oxygen                         saturations were monitored continuously. The                          Colonoscope was introduced through the anus and                         advanced to the the cecum, identified by appendiceal                         orifice and ileocecal valve. The colonoscopy was                         performed without difficulty. The patient tolerated                         the procedure well. The quality of the bowel                         preparation was excellent. Findings:      The perianal and digital rectal examinations were normal.      Two sessile polyps were found in the transverse colon. The polyps were 3       to 4 mm in size. These polyps were removed with a cold biopsy forceps.       Resection and retrieval were complete.      Multiple small-mouthed diverticula were found in the sigmoid colon,       descending colon and ascending colon.      Non-bleeding internal hemorrhoids were found during retroflexion. The       hemorrhoids were Grade I (internal hemorrhoids that do not prolapse). Impression:            - Two 3 to 4 mm polyps in the transverse colon,                         removed with a cold biopsy forceps. Resected and                         retrieved.                        - Diverticulosis in the sigmoid colon, in the                         descending colon and in the ascending colon.                        - Non-bleeding internal hemorrhoids. Recommendation:        - Discharge patient to home.                        -  Resume previous diet.                        - Continue present medications.                        - Await pathology results.                        - Repeat colonoscopy in 7 years for surveillance. Procedure Code(s):     --- Professional ---                        [phone], Colonoscopy, flexible; with biopsy, single or                         multiple Diagnosis Code(s):     --- Professional ---                        Z86.010, Personal history of colonic polyps                        K63.5, Polyp of colon CPT  copyright 2019 American Medical Association. All rights reserved. The codes documented in this report are preliminary and upon coder review may  be revised to meet current compliance requirements. [name] MD, MD 04/30/2021 9:06:08 AM This report has been signed electronically. Number of Addenda: 0 Note Initiated On: 04/30/2021 8:43 AM Scope Withdrawal Time: 0 hours 6 minutes 37 seconds  Total Procedure Duration: 0 hours 11 minutes 19 seconds  Estimated Blood Loss:  Estimated blood loss: none.      [facility]

## 2021-04-30 NOTE — Transfer of Care (Signed)
Immediate Anesthesia Transfer of Care Note  Patient: Eugene Kramer  Procedure(s) Performed: COLONOSCOPY WITH PROPOFOL (Rectum) POLYPECTOMY (Rectum)  Patient Location: PACU  Anesthesia Type: General  Level of Consciousness: awake, alert  and patient cooperative  Airway and Oxygen Therapy: Patient Spontanous Breathing and Patient connected to supplemental oxygen  Post-op Assessment: Post-op Vital signs reviewed, Patient's Cardiovascular Status Stable, Respiratory Function Stable, Patent Airway and No signs of Nausea or vomiting  Post-op Vital Signs: Reviewed and stable  Complications: No notable events documented.

## 2021-05-01 LAB — LIPID PANEL
Chol/HDL Ratio: 6.3 ratio — ABNORMAL HIGH (ref 0.0–5.0)
Cholesterol, Total: 203 mg/dL — ABNORMAL HIGH (ref 100–199)
HDL: 32 mg/dL — ABNORMAL LOW (ref >39)
LDL Chol Calc (NIH): 142 mg/dL — ABNORMAL HIGH (ref 0–99)
Triglycerides: 160 mg/dL — ABNORMAL HIGH (ref 0–149)
VLDL Cholesterol Cal: 29 mg/dL (ref 5–40)

## 2021-05-03 ENCOUNTER — Other Ambulatory Visit: Payer: Self-pay

## 2021-05-03 ENCOUNTER — Encounter: Payer: Self-pay | Admitting: Family Medicine

## 2021-05-03 ENCOUNTER — Encounter: Payer: Self-pay | Admitting: Gastroenterology

## 2021-05-03 DIAGNOSIS — E782 Mixed hyperlipidemia: Secondary | ICD-10-CM

## 2021-05-03 NOTE — Progress Notes (Signed)
Ref cardio/ [employer]

## 2021-05-04 LAB — SURGICAL PATHOLOGY

## 2021-05-06 ENCOUNTER — Encounter: Payer: Self-pay | Admitting: Gastroenterology

## 2021-05-19 ENCOUNTER — Other Ambulatory Visit: Payer: Self-pay | Admitting: Internal Medicine

## 2021-05-19 DIAGNOSIS — E782 Mixed hyperlipidemia: Secondary | ICD-10-CM

## 2021-05-20 ENCOUNTER — Ambulatory Visit
Admission: RE | Admit: 2021-05-20 | Discharge: 2021-05-20 | Disposition: A | Discharge status: Home / Self Care | Payer: BC Managed Care – PPO | Source: Ambulatory Visit | Attending: Internal Medicine | Admitting: Internal Medicine

## 2021-05-20 ENCOUNTER — Other Ambulatory Visit: Payer: Self-pay

## 2021-05-20 DIAGNOSIS — E782 Mixed hyperlipidemia: Secondary | ICD-10-CM | POA: Insufficient documentation

## 2021-10-06 ENCOUNTER — Encounter: Payer: Self-pay | Admitting: Family Medicine

## 2021-10-22 ENCOUNTER — Ambulatory Visit: Payer: BC Managed Care – PPO | Admitting: Family Medicine

## 2021-10-22 ENCOUNTER — Encounter: Payer: Self-pay | Admitting: Family Medicine

## 2021-10-22 ENCOUNTER — Other Ambulatory Visit: Payer: Self-pay

## 2021-10-22 VITALS — BP 110/70 | HR 80 | Ht 72.0 in | Wt 208.0 lb

## 2021-10-22 DIAGNOSIS — J019 Acute sinusitis, unspecified: Secondary | ICD-10-CM | POA: Diagnosis not present

## 2021-10-22 DIAGNOSIS — J4520 Mild intermittent asthma, uncomplicated: Secondary | ICD-10-CM | POA: Diagnosis not present

## 2021-10-22 MED ORDER — AMOXICILLIN 500 MG PO CAPS: 500.0000 mg | ORAL_CAPSULE | Freq: Three times a day (TID) | ORAL | 0 refills | Status: AC

## 2021-10-22 MED ORDER — ALBUTEROL SULFATE HFA 108 (90 BASE) MCG/ACT IN AERS: 2.0000 | INHALATION_SPRAY | Freq: Four times a day (QID) | RESPIRATORY_TRACT | 6 refills | Status: AC | PRN

## 2021-10-22 NOTE — Progress Notes (Signed)
Date:  10/22/2021   Name:  Eugene Kramer   DOB:  [birth year]   MRN:  [id number]   Chief Complaint: Sinusitis (Green production, cough, sneezing, no fever or SOB, hoarse, COVID negative)  Sinusitis This is a new problem. The current episode started in the past 7 days. The problem has been gradually improving since onset. There has been no fever. The pain is mild. Associated symptoms include congestion, coughing, ear pain and sinus pressure. Pertinent negatives include no chills, headaches, neck pain, shortness of breath or sore throat. (Prod green/yellow cough) Treatments tried: flonase/symcicort. The treatment provided no relief.   Lab Results  Component Value Date   NA 140 03/11/2021   K 5.0 03/11/2021   CO2 25 03/11/2021   GLUCOSE 85 03/11/2021   BUN 10 03/11/2021   CREATININE 0.77 03/11/2021   CALCIUM 9.4 03/11/2021   EGFR 101 03/11/2021   GFRNONAA 76 03/10/2020   Lab Results  Component Value Date   CHOL 203 (H) 04/30/2021   HDL 32 (L) 04/30/2021   LDLCALC 142 (H) 04/30/2021   TRIG 160 (H) 04/30/2021   CHOLHDL 6.3 (H) 04/30/2021   No results found for: TSH No results found for: HGBA1C Lab Results  Component Value Date   WBC 7.6 03/11/2021   HGB 15.9 03/11/2021   HCT 45.3 03/11/2021   MCV 89 03/11/2021   PLT 178 03/11/2021   Lab Results  Component Value Date   ALT 22 03/11/2021   AST 25 03/11/2021   ALKPHOS 102 03/11/2021   BILITOT 0.6 03/11/2021   No results found for: 25OHVITD2, 25OHVITD3, VD25OH   Review of Systems  Constitutional:  Negative for chills and fever.  HENT:  Positive for congestion, ear pain and sinus pressure. Negative for drooling, ear discharge and sore throat.   Respiratory:  Positive for cough. Negative for shortness of breath and wheezing.   Cardiovascular:  Negative for chest pain, palpitations and leg swelling.  Gastrointestinal:  Negative for abdominal pain, blood in stool, constipation, diarrhea and nausea.  Endocrine: Negative  for polydipsia.  Genitourinary:  Negative for dysuria, frequency, hematuria and urgency.  Musculoskeletal:  Negative for back pain, myalgias and neck pain.  Skin:  Negative for rash.  Allergic/Immunologic: Negative for environmental allergies.  Neurological:  Negative for dizziness and headaches.  Hematological:  Does not bruise/bleed easily.  Psychiatric/Behavioral:  Negative for suicidal ideas. The patient is not nervous/anxious.    There are no problems to display for this patient.   Allergies  Allergen Reactions   Erythromycin Swelling    Past Surgical History:  Procedure Laterality Date   COLONOSCOPY  10/25/2014   Dr Allen Norris   COLONOSCOPY WITH PROPOFOL N/A 04/30/2021   Procedure: COLONOSCOPY WITH PROPOFOL;  Surgeon: [name], MD;  Location: [place];  Service: Endoscopy;  Laterality: N/A;   HERNIA REPAIR     POLYPECTOMY  04/30/2021   Procedure: POLYPECTOMY;  Surgeon: [name], MD;  Location: [place];  Service: Endoscopy;;   TONSILLECTOMY AND ADENOIDECTOMY      Social History   Tobacco Use   Smoking status: Former    Types: Cigarettes    Quit date: 1987    Years since quitting: 36.1   Smokeless tobacco: Current    Types: Snuff  Vaping Use   Vaping Use: Never used  Substance Use Topics   Alcohol use: Yes    Comment: rare   Drug use: No     Medication list has been reviewed  and updated.  Current Meds  Medication Sig   albuterol (VENTOLIN HFA) 108 (90 Base) MCG/ACT inhaler Inhale 2 puffs into the lungs every 6 (six) hours as needed for wheezing or shortness of breath.   APPLE CIDER VINEGAR PO Take 1 ampule by mouth daily.   budesonide-formoterol (SYMBICORT) 80-4.5 MCG/ACT inhaler Inhale 2 puffs into the lungs every morning.   Cinnamon 500 MG TABS Take 1 tablet by mouth daily.   fluticasone (FLONASE) 50 MCG/ACT nasal spray Place 1 spray into both nostrils daily.   Garlic 938 MG CAPS Take 1 capsule by mouth daily.    ipratropium-albuterol (DUONEB) 0.5-2.5 (3) MG/3ML SOLN Take 3 mLs by nebulization every 6 (six) hours as needed.   Misc Natural Products (PROSTATE HEALTH) CAPS Take 1 capsule by mouth daily.   Omega-3 Fatty Acids (FISH OIL) 1000 MG CAPS Take 1 capsule by mouth daily.   rosuvastatin (CRESTOR) 5 MG tablet Take 0.5 mg by mouth daily. callwood   tadalafil (CIALIS) 5 MG tablet Take 1 tablet (5 mg total) by mouth daily as needed for erectile dysfunction.   [DISCONTINUED] Red Yeast Rice Extract (RED YEAST RICE PO) Take by mouth.   Current Facility-Administered Medications for the 10/22/21 encounter (Office Visit) with Juline Patch, MD  Medication   albuterol (PROVENTIL) (2.5 MG/3ML) 0.083% nebulizer solution 2.5 mg    PHQ 2/9 Scores 03/11/2021 03/10/2020 06/24/2019 04/06/2017  PHQ - 2 Score 0 0 0 1  PHQ- 9 Score 0 0 - 3    GAD 7 : Generalized Anxiety Score 03/11/2021 03/10/2020  Nervous, Anxious, on Edge 0 1  Control/stop worrying 0 0  Worry too much - different things 0 1  Trouble relaxing 0 0  Restless 0 0  Easily annoyed or irritable 0 0  Afraid - awful might happen 0 0  Total GAD 7 Score 0 2  Anxiety Difficulty - Not difficult at all    BP Readings from Last 3 Encounters:  10/22/21 110/70  04/30/21 105/68  03/11/21 120/80    Physical Exam Vitals and nursing note reviewed.  HENT:     Right Ear: Tympanic membrane normal.     Left Ear: A middle ear effusion is present.     Ears:     Comments: Bullous myringitis    Nose:     Right Sinus: No maxillary sinus tenderness or frontal sinus tenderness.     Left Sinus: No maxillary sinus tenderness or frontal sinus tenderness.     Mouth/Throat:     Pharynx: Posterior oropharyngeal erythema present.  Abdominal:     General: Bowel sounds are normal.     Tenderness: There is no abdominal tenderness.    Wt Readings from Last 3 Encounters:  10/22/21 208 lb (94.3 kg)  04/30/21 198 lb (89.8 kg)  03/11/21 203 lb (92.1 kg)    BP 110/70     Pulse 80    Ht 6' (1.829 m)    Wt 208 lb (94.3 kg)    SpO2 97%    BMI 28.21 kg/m   Assessment and Plan:  1. Acute sinusitis, recurrence not specified, unspecified location New onset.  Persistent.  Stable.  Exam and history is consistent with sinusitis and will treat amoxicillin 500 mg 3 times a day for 10 days. - amoxicillin (AMOXIL) 500 MG capsule; Take 1 capsule (500 mg total) by mouth 3 (three) times daily for 10 days.  Dispense: 30 capsule; Refill: 0  2. Mild intermittent asthma without complication Chronic.  Intermittent.  Mild.  Controlled with Symbicort and albuterol inhaler 2 puffs every 6 hours. - albuterol (VENTOLIN HFA) 108 (90 Base) MCG/ACT inhaler; Inhale 2 puffs into the lungs every 6 (six) hours as needed for wheezing or shortness of breath.  Dispense: 8 g; Refill: 6

## 2021-12-26 ENCOUNTER — Telehealth: Payer: Self-pay | Admitting: Gastroenterology

## 2021-12-26 DIAGNOSIS — R1013 Epigastric pain: Secondary | ICD-10-CM

## 2021-12-26 MED ORDER — OMEPRAZOLE 40 MG PO CPDR: 40.0000 mg | DELAYED_RELEASE_CAPSULE | Freq: Every day | ORAL | 0 refills | Status: DC

## 2021-12-26 NOTE — Telephone Encounter (Signed)
Patient of Dr. Allen Norris, had hamburger with chips yesterday for supper.  He started having severe bloating, felt like having a BM, and pain right below the sternal notch.  This morning, he had 2 episodes of vomiting after he brushed and had coffee.  He reports the pain is better but constantly burping.  He feels bloated.  He denies any fever, chills ? ?Advised him that he could either go to the ER if he continues to feel worse and stay on liquid diet all day today. ?Advised him to get labs done tomorrow if his symptoms are manageable at home.   ? ?Please release the labs I ordered ? ?Sent in prescription for Prilosec 40 mg once a day ? ?Patient expressed understanding of the plan ? ?Cephas Darby, MD ?[facility] [place] ?[address]  ?[address]  ?[place], [place] [address]  ?Main: [phone]  ?Fax: [phone] ?Pager: [phone] ? ?

## 2021-12-27 ENCOUNTER — Other Ambulatory Visit: Payer: Self-pay

## 2021-12-27 DIAGNOSIS — R1013 Epigastric pain: Secondary | ICD-10-CM

## 2021-12-28 ENCOUNTER — Telehealth: Payer: Self-pay | Admitting: Gastroenterology

## 2021-12-28 LAB — COMPREHENSIVE METABOLIC PANEL
ALT: 23 IU/L (ref 0–44)
AST: 26 IU/L (ref 0–40)
Albumin/Globulin Ratio: 2.3 — ABNORMAL HIGH (ref 1.2–2.2)
Albumin: 4.8 g/dL (ref 3.8–4.8)
Alkaline Phosphatase: 118 IU/L (ref 44–121)
BUN/Creatinine Ratio: 10 (ref 10–24)
BUN: 11 mg/dL (ref 8–27)
Bilirubin Total: 0.9 mg/dL (ref 0.0–1.2)
CO2: 23 mmol/L (ref 20–29)
Calcium: 9.5 mg/dL (ref 8.6–10.2)
Chloride: 100 mmol/L (ref 96–106)
Creatinine, Ser: 1.05 mg/dL (ref 0.76–1.27)
Globulin, Total: 2.1 g/dL (ref 1.5–4.5)
Glucose: 92 mg/dL (ref 70–99)
Potassium: 4.4 mmol/L (ref 3.5–5.2)
Sodium: 140 mmol/L (ref 134–144)
Total Protein: 6.9 g/dL (ref 6.0–8.5)
eGFR: 80 mL/min/{1.73_m2} (ref >59)

## 2021-12-28 LAB — CBC
Hematocrit: 48.6 % (ref 37.5–51.0)
Hemoglobin: 16.5 g/dL (ref 13.0–17.7)
MCH: 30.3 pg (ref 26.6–33.0)
MCHC: 34 g/dL (ref 31.5–35.7)
MCV: 89 fL (ref 79–97)
Platelets: 195 10*3/uL (ref 150–450)
RBC: 5.44 x10E6/uL (ref 4.14–5.80)
RDW: 12.4 % (ref 11.6–15.4)
WBC: 11.6 10*3/uL — ABNORMAL HIGH (ref 3.4–10.8)

## 2021-12-28 NOTE — Telephone Encounter (Signed)
This would go to Dr. Allen Norris  ?

## 2021-12-28 NOTE — Telephone Encounter (Signed)
Patient calling requesting a nurse call back and go over lab results. ? ? ?Did not know who to send message to. Patient had seen Dr Allen Norris and Dr Marius Ditch. ?

## 2021-12-29 LAB — H. PYLORI BREATH TEST: H pylori Breath Test: NEGATIVE

## 2022-03-09 ENCOUNTER — Telehealth: Payer: Self-pay | Admitting: Family Medicine

## 2022-03-09 NOTE — Telephone Encounter (Signed)
Pt would like labs ordered prior to appointment on Monday. Informed pt you were out of town and not sure I would be able get them ordered, pt voiced understanding.

## 2022-03-09 NOTE — Telephone Encounter (Signed)
Copied from [place] [id number]. Topic: General - Other >> Mar 09, 2022 10:20 AM Burman Freestone wrote: Reason for CRM: Pt is requesting an order for blood work be placed so he can get results when he comes in for his physical on 03/14/22

## 2022-03-14 ENCOUNTER — Encounter: Payer: Self-pay | Admitting: Family Medicine

## 2022-03-14 ENCOUNTER — Ambulatory Visit (INDEPENDENT_AMBULATORY_CARE_PROVIDER_SITE_OTHER): Payer: BC Managed Care – PPO | Admitting: Family Medicine

## 2022-03-14 VITALS — BP 116/78 | HR 58 | Ht 72.0 in | Wt 208.0 lb

## 2022-03-14 DIAGNOSIS — Z0000 Encounter for general adult medical examination without abnormal findings: Secondary | ICD-10-CM

## 2022-03-14 DIAGNOSIS — N529 Male erectile dysfunction, unspecified: Secondary | ICD-10-CM

## 2022-03-14 DIAGNOSIS — E782 Mixed hyperlipidemia: Secondary | ICD-10-CM | POA: Diagnosis not present

## 2022-03-14 DIAGNOSIS — J301 Allergic rhinitis due to pollen: Secondary | ICD-10-CM | POA: Diagnosis not present

## 2022-03-14 DIAGNOSIS — J4520 Mild intermittent asthma, uncomplicated: Secondary | ICD-10-CM | POA: Diagnosis not present

## 2022-03-14 LAB — HEMOCCULT GUIAC POC 1CARD (OFFICE): Fecal Occult Blood, POC: NEGATIVE

## 2022-03-14 MED ORDER — ROSUVASTATIN CALCIUM 5 MG PO TABS: 2.5000 mg | ORAL_TABLET | Freq: Every day | ORAL | 1 refills | Status: AC

## 2022-03-14 MED ORDER — TADALAFIL 5 MG PO TABS: 5.0000 mg | ORAL_TABLET | Freq: Every day | ORAL | 11 refills | Status: DC | PRN

## 2022-03-14 MED ORDER — BUDESONIDE-FORMOTEROL FUMARATE 80-4.5 MCG/ACT IN AERO: 2.0000 | INHALATION_SPRAY | Freq: Every morning | RESPIRATORY_TRACT | 12 refills | Status: DC

## 2022-03-14 MED ORDER — FLUTICASONE PROPIONATE 50 MCG/ACT NA SUSP: 1.0000 | Freq: Every day | NASAL | 11 refills | Status: AC

## 2022-03-14 NOTE — Progress Notes (Signed)
Date:  03/14/2022   Name:  Eugene Kramer   DOB:  1956-06-19   MRN:  [id number]   Chief Complaint: Annual Exam  Patient is a 66 year old male who presents for a comprehensive physical exam. The patient reports the following problems: none. Health maintenance has been reviewed up to date.      Lab Results  Component Value Date   NA 140 12/27/2021   K 4.4 12/27/2021   CO2 23 12/27/2021   GLUCOSE 92 12/27/2021   BUN 11 12/27/2021   CREATININE 1.05 12/27/2021   CALCIUM 9.5 12/27/2021   EGFR 80 12/27/2021   GFRNONAA 76 03/10/2020   Lab Results  Component Value Date   CHOL 203 (H) 04/30/2021   HDL 32 (L) 04/30/2021   LDLCALC 142 (H) 04/30/2021   TRIG 160 (H) 04/30/2021   CHOLHDL 6.3 (H) 04/30/2021   No results found for: "TSH" No results found for: "HGBA1C" Lab Results  Component Value Date   WBC 11.6 (H) 12/27/2021   HGB 16.5 12/27/2021   HCT 48.6 12/27/2021   MCV 89 12/27/2021   PLT 195 12/27/2021   Lab Results  Component Value Date   ALT 23 12/27/2021   AST 26 12/27/2021   ALKPHOS 118 12/27/2021   BILITOT 0.9 12/27/2021   No results found for: "25OHVITD2", "25OHVITD3", "VD25OH"   Review of Systems  Constitutional:  Negative for chills and fever.  HENT:  Negative for drooling, ear discharge, ear pain and sore throat.   Respiratory:  Negative for cough, shortness of breath and wheezing.   Cardiovascular:  Negative for chest pain, palpitations and leg swelling.  Gastrointestinal:  Negative for abdominal pain, blood in stool, constipation, diarrhea and nausea.  Endocrine: Negative for polydipsia.  Genitourinary:  Negative for dysuria, frequency, hematuria and urgency.  Musculoskeletal:  Negative for back pain, myalgias and neck pain.  Skin:  Negative for rash.  Allergic/Immunologic: Negative for environmental allergies.  Neurological:  Negative for dizziness and headaches.  Hematological:  Does not bruise/bleed easily.  Psychiatric/Behavioral:  Negative  for suicidal ideas. The patient is not nervous/anxious.     There are no problems to display for this patient.   Allergies  Allergen Reactions   Erythromycin Swelling    Past Surgical History:  Procedure Laterality Date   COLONOSCOPY  10/25/2014   Dr Allen Norris   COLONOSCOPY WITH PROPOFOL N/A 04/30/2021   Procedure: COLONOSCOPY WITH PROPOFOL;  Surgeon: [name], MD;  Location: [facility];  Service: Endoscopy;  Laterality: N/A;   HERNIA REPAIR     POLYPECTOMY  04/30/2021   Procedure: POLYPECTOMY;  Surgeon: [name], MD;  Location: [facility];  Service: Endoscopy;;   TONSILLECTOMY AND ADENOIDECTOMY      Social History   Tobacco Use   Smoking status: Former    Types: Cigarettes    Quit date: 1987    Years since quitting: 36.5   Smokeless tobacco: Current    Types: Snuff  Vaping Use   Vaping Use: Never used  Substance Use Topics   Alcohol use: Yes    Comment: rare   Drug use: No     Medication list has been reviewed and updated.  No outpatient medications have been marked as taking for the 03/14/22 encounter (Office Visit) with Juline Patch, MD.   Current Facility-Administered Medications for the 03/14/22 encounter (Office Visit) with Juline Patch, MD  Medication   albuterol (PROVENTIL) (2.5 MG/3ML) 0.083% nebulizer solution 2.5 mg  03/14/2022    8:41 AM 03/11/2021    8:50 AM 03/10/2020    8:52 AM  GAD 7 : Generalized Anxiety Score  Nervous, Anxious, on Edge 0 0 1  Control/stop worrying 0 0 0  Worry too much - different things 0 0 1  Trouble relaxing 0 0 0  Restless 0 0 0  Easily annoyed or irritable 0 0 0  Afraid - awful might happen 0 0 0  Total GAD 7 Score 0 0 2  Anxiety Difficulty Not difficult at all  Not difficult at all       03/14/2022    8:41 AM 03/11/2021    8:49 AM 03/10/2020    8:52 AM  Depression screen PHQ 2/9  Decreased Interest 0 0 0  Down, Depressed, Hopeless 0 0 0  PHQ - 2 Score 0 0 0  Altered sleeping 0 0 0   Tired, decreased energy 0 0 0  Change in appetite 0 0 0  Feeling bad or failure about yourself  0 0 0  Trouble concentrating 0 0 0  Moving slowly or fidgety/restless 0 0 0  Suicidal thoughts 0 0 0  PHQ-9 Score 0 0 0  Difficult doing work/chores Not difficult at all      BP Readings from Last 3 Encounters:  03/14/22 116/78  10/22/21 110/70  04/30/21 105/68    Physical Exam Vitals and nursing note reviewed.  Constitutional:      Appearance: Normal appearance. He is well-groomed and normal weight.  HENT:     Head: Normocephalic.     Jaw: There is normal jaw occlusion.     Right Ear: Tympanic membrane, ear canal and external ear normal. Decreased hearing noted. There is no impacted cerumen.     Left Ear: Tympanic membrane, ear canal and external ear normal. Decreased hearing noted. There is no impacted cerumen.     Nose: Nose normal. No congestion or rhinorrhea.     Mouth/Throat:     Mouth: Mucous membranes are moist.     Dentition: Normal dentition. No dental tenderness, dental caries or gum lesions.     Tongue: No lesions.     Palate: No mass.     Pharynx: Oropharynx is clear. No pharyngeal swelling or posterior oropharyngeal erythema.  Eyes:     General: Lids are normal. Vision grossly intact. Gaze aligned appropriately. No scleral icterus.       Right eye: No discharge.        Left eye: No discharge.     Extraocular Movements: Extraocular movements intact.     Conjunctiva/sclera: Conjunctivae normal.     Pupils: Pupils are equal, round, and reactive to light.  Neck:     Thyroid: No thyroid mass, thyromegaly or thyroid tenderness.     Vascular: Normal carotid pulses. No carotid bruit, hepatojugular reflux or JVD.     Trachea: Trachea and phonation normal. No tracheal deviation.  Cardiovascular:     Rate and Rhythm: Normal rate and regular rhythm.     Chest Wall: PMI is not displaced.     Pulses: Normal pulses.          Carotid pulses are 2+ on the right side and 2+ on  the left side.      Radial pulses are 2+ on the right side and 2+ on the left side.       Popliteal pulses are 2+ on the right side and 2+ on the left side.  Dorsalis pedis pulses are 2+ on the right side.     Heart sounds: Normal heart sounds, S1 normal and S2 normal. No murmur heard.    No systolic murmur is present.     No diastolic murmur is present.     No friction rub. No gallop. No S3 or S4 sounds.  Pulmonary:     Effort: Pulmonary effort is normal. No respiratory distress.     Breath sounds: Normal breath sounds. No decreased breath sounds, wheezing, rhonchi or rales.  Chest:     Chest wall: No mass.  Breasts:    Right: Normal.  Abdominal:     General: Bowel sounds are normal.     Palpations: Abdomen is soft. There is no hepatomegaly, splenomegaly or mass.     Tenderness: There is no abdominal tenderness. There is no right CVA tenderness, left CVA tenderness, guarding or rebound.     Hernia: There is no hernia in the left inguinal area or right inguinal area.  Genitourinary:    Penis: Uncircumcised.      Testes: Normal.     Epididymis:     Right: Normal.     Left: Normal.  Musculoskeletal:        General: No tenderness. Normal range of motion.     Cervical back: Full passive range of motion without pain, normal range of motion and neck supple.     Right lower leg: No edema.     Left lower leg: No edema.  Lymphadenopathy:     Head:     Right side of head: No submental, submandibular or tonsillar adenopathy.     Left side of head: No submental, submandibular or tonsillar adenopathy.     Cervical: No cervical adenopathy.     Right cervical: No superficial, deep or posterior cervical adenopathy.    Left cervical: No superficial, deep or posterior cervical adenopathy.     Upper Body:     Right upper body: No supraclavicular, axillary or pectoral adenopathy.     Left upper body: No supraclavicular, axillary or pectoral adenopathy.     Lower Body: No right inguinal  adenopathy. No left inguinal adenopathy.  Skin:    General: Skin is warm.     Capillary Refill: Capillary refill takes less than 2 seconds.     Findings: No rash.  Neurological:     Mental Status: He is alert and oriented to person, place, and time.     Cranial Nerves: Cranial nerves 2-12 are intact. No cranial nerve deficit.     Sensory: Sensation is intact.     Motor: Motor function is intact.     Deep Tendon Reflexes: Reflexes are normal and symmetric.     Reflex Scores:      Patellar reflexes are 2+ on the right side and 2+ on the left side. Psychiatric:        Behavior: Behavior is cooperative.     Wt Readings from Last 3 Encounters:  03/14/22 208 lb (94.3 kg)  10/22/21 208 lb (94.3 kg)  04/30/21 198 lb (89.8 kg)    BP 116/78   Pulse (!) 58   Ht 6' (1.829 m)   Wt 208 lb (94.3 kg)   BMI 28.21 kg/m   Assessment and Plan: Eugene Kramer is a 66 y.o. male who presents today for his Complete Annual Exam. He feels well. He reports exercising [place]. He reports he is sleeping fairly well.   1. Annual physical exam Immunizations are reviewed and  recommendations provided.   Age appropriate screening tests are discussed. Counseling given for risk factor reduction interventions.  No subjective/objective concerns noted during history and physical exam.  We will check lipid panel CMP and PSA.  Guaiac was noted to be negative - POCT Occult Blood Stool - Lipid Panel With LDL/HDL Ratio - Comprehensive Metabolic Panel (CMET) - PSA  2. Mild intermittent asthma without complication Chronic.  Controlled.  Stable.  Continue Symbicort 80-12.5 mg 2 puffs once a day. - budesonide-formoterol (SYMBICORT) 80-4.5 MCG/ACT inhaler; Inhale 2 puffs into the lungs every morning.  Dispense: 1 each; Refill: 12  3. Seasonal allergic rhinitis due to pollen Chronic.  Controlled.  Stable.  Continue Flonase nasal spray 1 spray both nostrils daily. - fluticasone (FLONASE) 50 MCG/ACT nasal spray; Place 1  spray into both nostrils daily.  Dispense: 16 g; Refill: 11  4. Mixed hyperlipidemia Chronic.  Controlled.  Stable.  Continue rosuvastatin 2.5 mg daily.  This is prescribed by Dr. Clayborn Bigness. - rosuvastatin (CRESTOR) 5 MG tablet; Take 0.5 tablets (2.5 mg total) by mouth daily. callwood  Dispense: 45 tablet; Refill: 1  5. Vasculogenic erectile dysfunction, unspecified vasculogenic erectile dysfunction type Chronic.  Controlled.  Stable.  Continue Cialis 5 mg as tolerated. - tadalafil (CIALIS) 5 MG tablet; Take 1 tablet (5 mg total) by mouth daily as needed for erectile dysfunction.  Dispense: 30 tablet; Refill: 11

## 2022-03-15 ENCOUNTER — Other Ambulatory Visit (INDEPENDENT_AMBULATORY_CARE_PROVIDER_SITE_OTHER): Payer: BC Managed Care – PPO

## 2022-03-15 DIAGNOSIS — Z1211 Encounter for screening for malignant neoplasm of colon: Secondary | ICD-10-CM

## 2022-03-15 LAB — LIPID PANEL WITH LDL/HDL RATIO
Cholesterol, Total: 173 mg/dL (ref 100–199)
HDL: 41 mg/dL (ref >39)
LDL Chol Calc (NIH): 103 mg/dL — ABNORMAL HIGH (ref 0–99)
LDL/HDL Ratio: 2.5 ratio (ref 0.0–3.6)
Triglycerides: 166 mg/dL — ABNORMAL HIGH (ref 0–149)
VLDL Cholesterol Cal: 29 mg/dL (ref 5–40)

## 2022-03-15 LAB — COMPREHENSIVE METABOLIC PANEL
ALT: 23 IU/L (ref 0–44)
AST: 27 IU/L (ref 0–40)
Albumin/Globulin Ratio: 2 (ref 1.2–2.2)
Albumin: 4.5 g/dL (ref 3.9–4.9)
Alkaline Phosphatase: 103 IU/L (ref 44–121)
BUN/Creatinine Ratio: 13 (ref 10–24)
BUN: 13 mg/dL (ref 8–27)
Bilirubin Total: 0.6 mg/dL (ref 0.0–1.2)
CO2: 26 mmol/L (ref 20–29)
Calcium: 9.5 mg/dL (ref 8.6–10.2)
Chloride: 103 mmol/L (ref 96–106)
Creatinine, Ser: 1 mg/dL (ref 0.76–1.27)
Globulin, Total: 2.2 g/dL (ref 1.5–4.5)
Glucose: 91 mg/dL (ref 70–99)
Potassium: 4.9 mmol/L (ref 3.5–5.2)
Sodium: 140 mmol/L (ref 134–144)
Total Protein: 6.7 g/dL (ref 6.0–8.5)
eGFR: 85 mL/min/{1.73_m2} (ref >59)

## 2022-03-15 LAB — HEMOCCULT GUIAC POC 1CARD (OFFICE): Fecal Occult Blood, POC: NEGATIVE

## 2022-03-15 LAB — PSA: Prostate Specific Ag, Serum: 0.7 ng/mL (ref 0.0–4.0)

## 2022-03-28 ENCOUNTER — Encounter: Payer: Self-pay | Admitting: Family Medicine

## 2022-08-23 ENCOUNTER — Telehealth: Payer: Self-pay | Admitting: Family Medicine

## 2022-08-23 NOTE — Telephone Encounter (Signed)
Copied from [place] [id number]. Topic: General - Other >> Aug 23, 2022  4:21 PM Everette C wrote: Reason for CRM: The patient has requested to speak directly with T Lynch when possible   The patient has been notified by their insurance provider that their prescriptions:  budesonide-formoterol (SYMBICORT) 80-4.5 MCG/ACT inhaler [[id number]] and fluticasone (FLONASE) 50 MCG/ACT nasal spray [[id number]] will not be covered by their insurance company   Please contact the patient further when possible to discuss their prescriptions

## 2022-10-19 ENCOUNTER — Ambulatory Visit: Payer: Self-pay | Admitting: *Deleted

## 2022-10-19 ENCOUNTER — Telehealth: Payer: Self-pay

## 2022-10-19 NOTE — Telephone Encounter (Signed)
  Chief Complaint: Abdominal Pain Symptoms: Pain "Three inches below navel." Radiates to back. "Pressure" H/O diverticulosis, states had mucoid type stools "All day yesterday." Normal BM today Frequency: Yesterday Pertinent Negatives: Patient denies  Disposition: '[]'$ ED /'[]'$ Urgent Care (no appt availability in office) / '[]'$ Appointment(In office/virtual)/ '[]'$  [facility]/ '[]'$ Home Care/ '[]'$ Refused Recommended Disposition /'[]'$ [facility]/ '[x]'$  Follow-up with PCP Additional Notes: Pt declines appt. States has started Omeprazole, took one last evening and this am. rx by GI. Pt states "I just want to know if this med will resolve what is happening now." States "Much better today", pain at 1/10.  Advised CB for worsening symptoms. Please advise Reason for Disposition  Age > 60 years  Answer Assessment - Initial Assessment Questions 1. LOCATION: "Where does it hurt?"      Lower abdomen. 2. RADIATION: "Does the pain shoot anywhere else?" (e.g., chest, back)     To back 3. ONSET: "When did the pain begin?" (Minutes, hours or days ago)      Yesterday 4. SUDDEN: "Gradual or sudden onset?"     Gradual 5. PATTERN "Does the pain come and go, or is it constant?"    - If it comes and goes: "How long does it last?" "Do you have pain now?"     (Note: Comes and goes means the pain is intermittent. It goes away completely between bouts.)    - If constant: "Is it getting better, staying the same, or getting worse?"      (Note: Constant means the pain never goes away completely; most serious pain is constant and gets worse.)      "Pressure" 6. SEVERITY: "How bad is the pain?"  (e.g., Scale 1-10; mild, moderate, or severe)    - MILD (1-3): Doesn't interfere with normal activities, abdomen soft and not tender to touch.     - MODERATE (4-7): Interferes with normal activities or awakens from sleep, abdomen tender to touch.     - SEVERE (8-10): Excruciating pain, doubled over, unable to do any normal  activities.       1/10 7. RECURRENT SYMPTOM: "Have you ever had this type of stomach pain before?" If Yes, ask: "When was the last time?" and "What happened that time?"     H/O diverticuloses 8. CAUSE: "What do you think is causing the stomach pain?"     Diverticulosis. 9. RELIEVING/AGGRAVATING FACTORS: "What makes it better or worse?" (e.g., antacids, bending or twisting motion, bowel movement)     Omeprazole this AM and yesterday evening 10. OTHER SYMPTOMS: "Do you have any other symptoms?" (e.g., back pain, diarrhea, fever, urination pain, vomiting)       Mucous type diarrhea yesterday, pressure. LBM this AM  Protocols used: Abdominal Pain - Male-A-AH

## 2022-10-19 NOTE — Telephone Encounter (Signed)
Pt lmovm and I returned his call... Pt c/o abd pain and stated that he believes it is diverticulitis... Pt requested for an appointment today, I let pt know that there were no available appts and that I would send a msg.... Pt said "I will be dead by then" and that he will just call his PCP...  Pt has not been seen in office since 2021 and had a virtual visit in 2022...   Is this appropriate to add for a virtual tomorrow?

## 2022-10-19 NOTE — Telephone Encounter (Signed)
I spoke to pt and he is aware of guidelines and expressed understanding... Pt states nothing further needed at this time

## 2022-10-24 ENCOUNTER — Ambulatory Visit: Payer: BC Managed Care – PPO | Admitting: Family Medicine

## 2022-10-24 ENCOUNTER — Encounter: Payer: Self-pay | Admitting: Family Medicine

## 2022-10-24 ENCOUNTER — Ambulatory Visit
Admission: RE | Admit: 2022-10-24 | Discharge: 2022-10-24 | Disposition: A | Discharge status: Home / Self Care | Payer: BC Managed Care – PPO | Source: Ambulatory Visit | Attending: Family Medicine | Admitting: Family Medicine

## 2022-10-24 ENCOUNTER — Ambulatory Visit
Admission: RE | Admit: 2022-10-24 | Discharge: 2022-10-24 | Disposition: A | Discharge status: Home / Self Care | Payer: BC Managed Care – PPO | Attending: Family Medicine | Admitting: Family Medicine

## 2022-10-24 VITALS — BP 108/60 | HR 88 | Ht 72.0 in | Wt 217.0 lb

## 2022-10-24 DIAGNOSIS — K219 Gastro-esophageal reflux disease without esophagitis: Secondary | ICD-10-CM | POA: Diagnosis not present

## 2022-10-24 DIAGNOSIS — R1013 Epigastric pain: Secondary | ICD-10-CM | POA: Insufficient documentation

## 2022-10-24 LAB — POCT URINALYSIS DIPSTICK
Bilirubin, UA: NEGATIVE
Blood, UA: NEGATIVE
Glucose, UA: NEGATIVE
Ketones, UA: NEGATIVE
Leukocytes, UA: NEGATIVE
Nitrite, UA: NEGATIVE
Protein, UA: NEGATIVE
Spec Grav, UA: 1.02 (ref 1.010–1.025)
Urobilinogen, UA: 0.2 E.U./dL
pH, UA: 6.5 (ref 5.0–8.0)

## 2022-10-24 MED ORDER — OMEPRAZOLE 40 MG PO CPDR: 40.0000 mg | DELAYED_RELEASE_CAPSULE | Freq: Every day | ORAL | 0 refills | Status: AC

## 2022-10-24 NOTE — Progress Notes (Addendum)
Date:  10/24/2022   Name:  Eugene Kramer   DOB:  1956-04-28   MRN:  [id number]   Chief Complaint: Abdominal Pain (Feels bloated "my hips are hurting and that is a sign something is going on in my stomach" Omeprazole is helping)  Abdominal Pain This is a new problem. The current episode started in the past 7 days (week ago yesterday). The onset quality is gradual. The problem has been resolved. The pain is located in the generalized abdominal region. The pain is at a severity of 7/10. The pain is moderate. The abdominal pain radiates to the back. Associated symptoms include belching, diarrhea, a fever, flatus and hematochezia. Pertinent negatives include no constipation, melena, nausea or vomiting. Associated symptoms comments: Bm normal now. The pain is aggravated by palpation. Relieved by: decreased on own. He has tried proton pump inhibitors for the symptoms. The treatment provided moderate relief. Prior diagnostic workup includes lower endoscopy. diverticulosis    Lab Results  Component Value Date   NA 140 03/14/2022   K 4.9 03/14/2022   CO2 26 03/14/2022   GLUCOSE 91 03/14/2022   BUN 13 03/14/2022   CREATININE 1.00 03/14/2022   CALCIUM 9.5 03/14/2022   EGFR 85 03/14/2022   GFRNONAA 76 03/10/2020   Lab Results  Component Value Date   CHOL 173 03/14/2022   HDL 41 03/14/2022   LDLCALC 103 (H) 03/14/2022   TRIG 166 (H) 03/14/2022   CHOLHDL 6.3 (H) 04/30/2021   No results found for: "TSH" No results found for: "HGBA1C" Lab Results  Component Value Date   WBC 11.6 (H) 12/27/2021   HGB 16.5 12/27/2021   HCT 48.6 12/27/2021   MCV 89 12/27/2021   PLT 195 12/27/2021   Lab Results  Component Value Date   ALT 23 03/14/2022   AST 27 03/14/2022   ALKPHOS 103 03/14/2022   BILITOT 0.6 03/14/2022   No results found for: "25OHVITD2", "25OHVITD3", "VD25OH"   Review of Systems  Constitutional:  Positive for chills and fever.  Respiratory:  Negative for shortness of breath.    Gastrointestinal:  Positive for abdominal distention, abdominal pain, diarrhea, flatus and hematochezia. Negative for blood in stool, constipation, melena, nausea and vomiting.       Bloated    There are no problems to display for this patient.   Allergies  Allergen Reactions   Erythromycin Swelling    Past Surgical History:  Procedure Laterality Date   COLONOSCOPY  10/25/2014   Dr Allen Norris   COLONOSCOPY WITH PROPOFOL N/A 04/30/2021   Procedure: COLONOSCOPY WITH PROPOFOL;  Surgeon: [name], MD;  Location: [facility];  Service: Endoscopy;  Laterality: N/A;   HERNIA REPAIR     POLYPECTOMY  04/30/2021   Procedure: POLYPECTOMY;  Surgeon: [name], MD;  Location: [facility];  Service: Endoscopy;;   TONSILLECTOMY AND ADENOIDECTOMY      Social History   Tobacco Use   Smoking status: Former    Types: Cigarettes    Quit date: 1987    Years since quitting: 37.1   Smokeless tobacco: Current    Types: Snuff  Vaping Use   Vaping Use: Never used  Substance Use Topics   Alcohol use: Yes    Comment: rare   Drug use: No     Medication list has been reviewed and updated.  Current Meds  Medication Sig   albuterol (VENTOLIN HFA) 108 (90 Base) MCG/ACT inhaler Inhale 2 puffs into the lungs every 6 (six) hours  as needed for wheezing or shortness of breath.   APPLE CIDER VINEGAR PO Take 1 ampule by mouth daily.   budesonide-formoterol (SYMBICORT) 80-4.5 MCG/ACT inhaler Inhale 2 puffs into the lungs every morning.   Cinnamon 500 MG TABS Take 1 tablet by mouth daily.   ezetimibe (ZETIA) 10 MG tablet Take 10 mg by mouth daily.   fluticasone (FLONASE) 50 MCG/ACT nasal spray Place 1 spray into both nostrils daily.   Garlic [id number] MG CAPS Take 1 capsule by mouth daily.   ipratropium-albuterol (DUONEB) 0.5-2.5 (3) MG/3ML SOLN Take 3 mLs by nebulization every 6 (six) hours as needed.   Misc Natural Products (PROSTATE HEALTH) CAPS Take 1 capsule by mouth daily.   Omega-3  Fatty Acids (FISH OIL) 1000 MG CAPS Take 1 capsule by mouth daily.   omeprazole (PRILOSEC) 40 MG capsule Take 1 capsule (40 mg total) by mouth daily.   rosuvastatin (CRESTOR) 5 MG tablet Take 0.5 tablets (2.5 mg total) by mouth daily. [place]   tadalafil (CIALIS) 5 MG tablet Take 1 tablet (5 mg total) by mouth daily as needed for erectile dysfunction.   Current Facility-Administered Medications for the 10/24/22 encounter (Office Visit) with Juline Patch, MD  Medication   albuterol (PROVENTIL) (2.5 MG/3ML) 0.083% nebulizer solution 2.5 mg       10/24/2022    2:57 PM 03/14/2022    8:41 AM 03/11/2021    8:50 AM 03/10/2020    8:52 AM  GAD 7 : Generalized Anxiety Score  Nervous, Anxious, on Edge 0 0 0 1  Control/stop worrying 0 0 0 0  Worry too much - different things 0 0 0 1  Trouble relaxing 0 0 0 0  Restless 0 0 0 0  Easily annoyed or irritable 0 0 0 0  Afraid - awful might happen 0 0 0 0  Total GAD 7 Score 0 0 0 2  Anxiety Difficulty Not difficult at all Not difficult at all  Not difficult at all       10/24/2022    2:57 PM 03/14/2022    8:41 AM 03/11/2021    8:49 AM  Depression screen PHQ 2/9  Decreased Interest 0 0 0  Down, Depressed, Hopeless 0 0 0  PHQ - 2 Score 0 0 0  Altered sleeping 0 0 0  Tired, decreased energy 0 0 0  Change in appetite 0 0 0  Feeling bad or failure about yourself  0 0 0  Trouble concentrating 0 0 0  Moving slowly or fidgety/restless 0 0 0  Suicidal thoughts 0 0 0  PHQ-9 Score 0 0 0  Difficult doing work/chores Not difficult at all Not difficult at all     BP Readings from Last 3 Encounters:  10/24/22 108/60  03/14/22 116/78  10/22/21 110/70    Physical Exam Vitals and nursing note reviewed.  Cardiovascular:     Chest Wall: PMI is not displaced. No thrill.     Heart sounds: S1 normal and S2 normal. No murmur heard.    No systolic murmur is present.     No diastolic murmur is present.     No gallop. No S3 or S4 sounds.  Abdominal:      General: Bowel sounds are increased.     Palpations: There is no hepatomegaly or splenomegaly.     Tenderness: There is abdominal tenderness in the right lower quadrant. There is no guarding or rebound.  Genitourinary:    Rectum: Normal. Guaiac result negative. No mass  or tenderness.  Neurological:     Mental Status: He is alert.     Wt Readings from Last 3 Encounters:  10/24/22 217 lb (98.4 kg)  03/14/22 208 lb (94.3 kg)  10/22/21 208 lb (94.3 kg)    BP 108/60   Pulse 88   Ht 6' (1.829 m)   Wt 217 lb (98.4 kg)   SpO2 97%   BMI 29.43 kg/m   Assessment and Plan: 1. Abdominal pain, epigastric New onset.  Gradually improving but not resolved.  Initial examination of abdomen notes bowel sounds, none guarding but tenderness to deep palpation initially in the right lower quadrant, without rebound, without CVA tenderness.  Urinalysis was done and it was unremarkable Hemoccult of stool was negative we will obtain a BMP, CBC, C-reactive protein, and obtain 2 views of the abdomen to rule out intra-abdominal air.  If pain should worsen over the course of the evening patient is to go to the hospital meantime we will resume omeprazole 40 mg once a day.  Continue conservative therapy with  liquids.  Diet tonight and n.p.o. after midnight and no food until I discussed results in the morning. - omeprazole (PRILOSEC) 40 MG capsule; Take 1 capsule (40 mg total) by mouth daily.  Dispense: 30 capsule; Refill: 0 - CBC with Differential/Platelet - Basic metabolic panel - C-reactive protein - DG Abd 2 Views; Future  2.  Gastroesophageal disease without esophagitis refilled omeprazole 40 mg once a day.   Otilio Miu, MD

## 2022-10-24 NOTE — Addendum Note (Signed)
Addended by: Fredderick Severance on: 10/24/2022 04:14 PM   Modules accepted: Orders

## 2022-10-25 LAB — CBC WITH DIFFERENTIAL/PLATELET
Basophils Absolute: 0.1 10*3/uL (ref 0.0–0.2)
Basos: 1 %
EOS (ABSOLUTE): 0.3 10*3/uL (ref 0.0–0.4)
Eos: 3 %
Hematocrit: 41.1 % (ref 37.5–51.0)
Hemoglobin: 14.1 g/dL (ref 13.0–17.7)
Immature Grans (Abs): 0 10*3/uL (ref 0.0–0.1)
Immature Granulocytes: 0 %
Lymphocytes Absolute: 2.1 10*3/uL (ref 0.7–3.1)
Lymphs: 26 %
MCH: 30.6 pg (ref 26.6–33.0)
MCHC: 34.3 g/dL (ref 31.5–35.7)
MCV: 89 fL (ref 79–97)
Monocytes Absolute: 0.6 10*3/uL (ref 0.1–0.9)
Monocytes: 7 %
Neutrophils Absolute: 5.1 10*3/uL (ref 1.4–7.0)
Neutrophils: 63 %
Platelets: 183 10*3/uL (ref 150–450)
RBC: 4.61 x10E6/uL (ref 4.14–5.80)
RDW: 12 % (ref 11.6–15.4)
WBC: 8.2 10*3/uL (ref 3.4–10.8)

## 2022-10-25 LAB — BASIC METABOLIC PANEL
BUN/Creatinine Ratio: 14 (ref 10–24)
BUN: 14 mg/dL (ref 8–27)
CO2: 25 mmol/L (ref 20–29)
Calcium: 8.9 mg/dL (ref 8.6–10.2)
Chloride: 106 mmol/L (ref 96–106)
Creatinine, Ser: 0.98 mg/dL (ref 0.76–1.27)
Glucose: 76 mg/dL (ref 70–99)
Potassium: 4.1 mmol/L (ref 3.5–5.2)
Sodium: 144 mmol/L (ref 134–144)
eGFR: 86 mL/min/{1.73_m2} (ref >59)

## 2022-10-25 LAB — C-REACTIVE PROTEIN: CRP: 3 mg/L (ref 0–10)

## 2023-01-14 IMAGING — CT CT CARDIAC CORONARY ARTERY CALCIUM SCORE
3 series · 14 of 20 positions shown, 16 images · non-contrast
Comparison: 06/26/2017 chest radiograph.

Addendum:
CLINICAL DATA: Risk stratification

EXAM:
Coronary Calcium Score
TECHNIQUE: The patient was scanned on a Siemens Somatom go.Top Scanner. Axial
non-contrast 3 mm slices were carried out through the heart. The
data set was analyzed on a dedicated work station and scored using
the Agatson method.

[Series 2: sa36 calcium scoring 3.00 · axial · 0.33mm/px · z∈[-1115,-1034]mm · 4 of 46 slices shown]
[im 10/46  vessel]
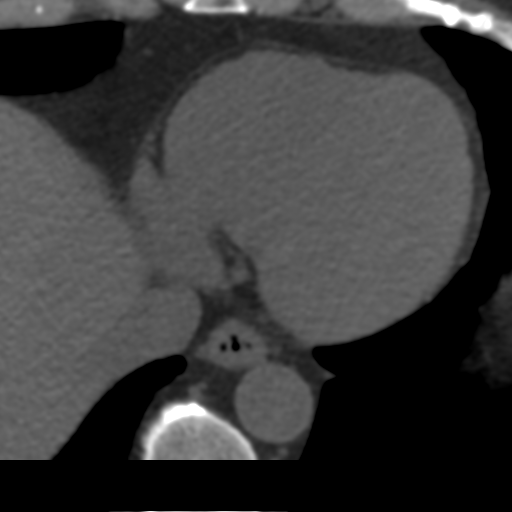
[im 19/46  vessel]
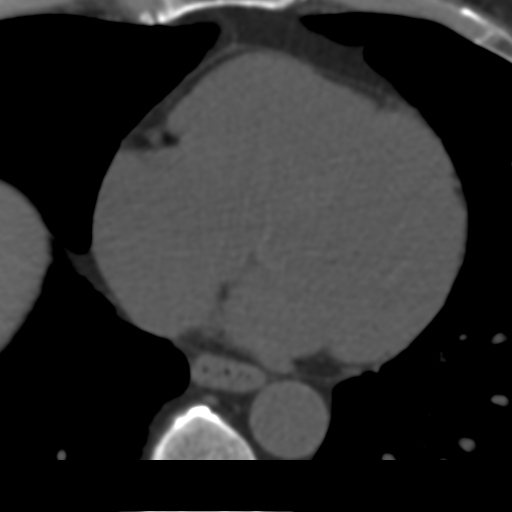
[im 28/46  vessel]
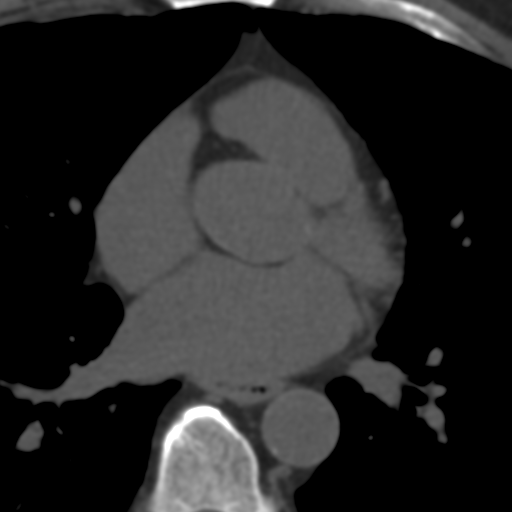
[im 37/46  vessel]
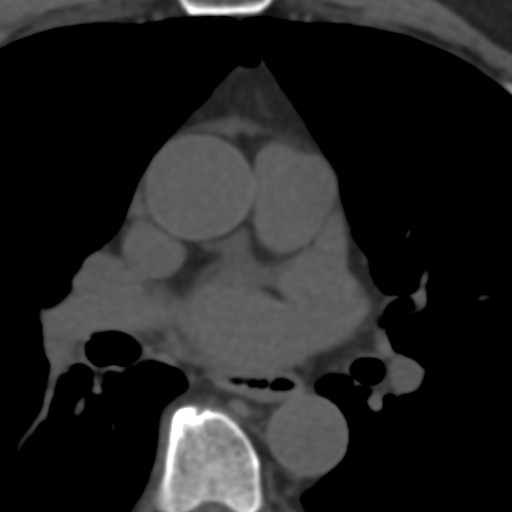

[Series 5: full fov st calcium scoring 3.00 · axial · 0.67mm/px · z∈[-1121,-1031]mm · 5 of 46 slices shown, 7 images]
[im 8/46  vessel]
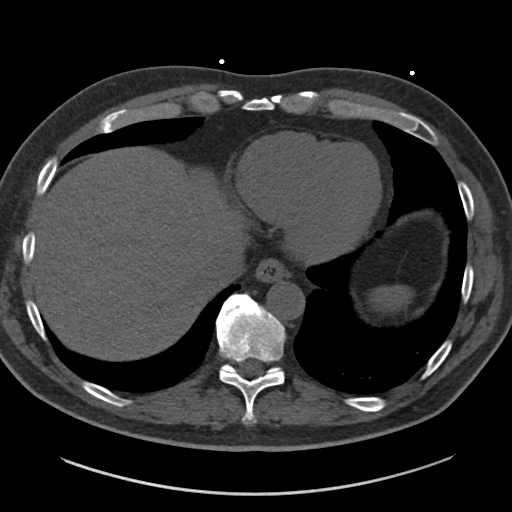
[im 8/46  lung]
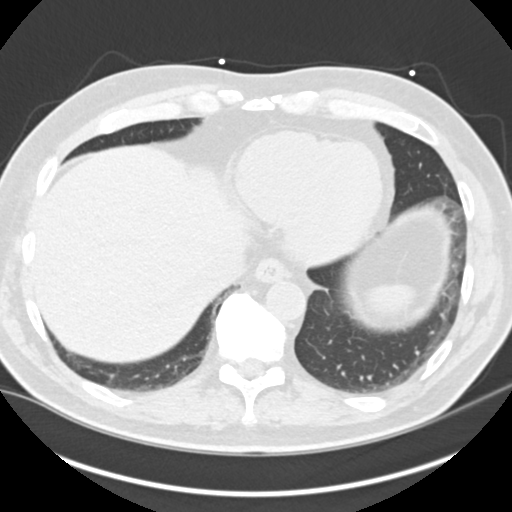
[im 16/46  vessel]
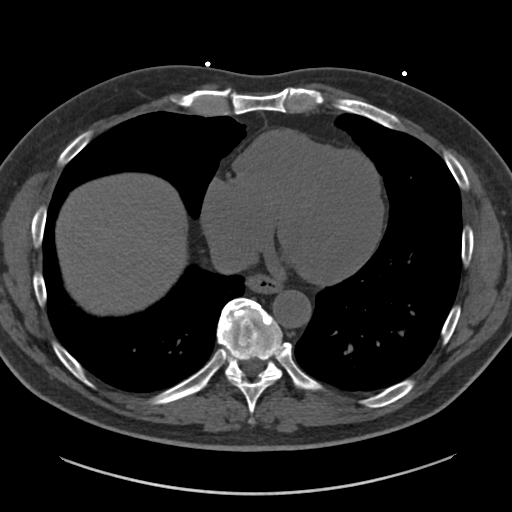
[im 23/46  vessel]
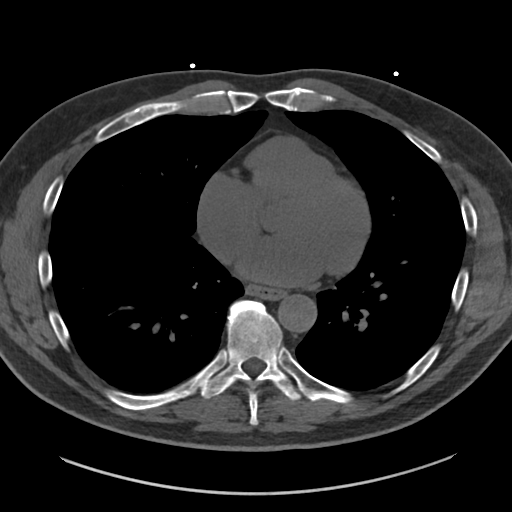
[im 31/46  vessel]
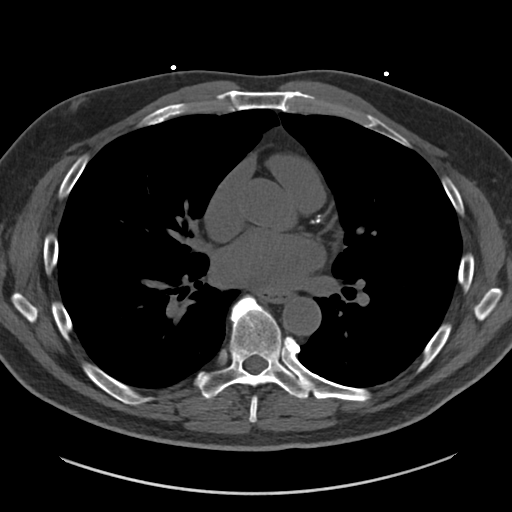
[im 38/46  vessel]
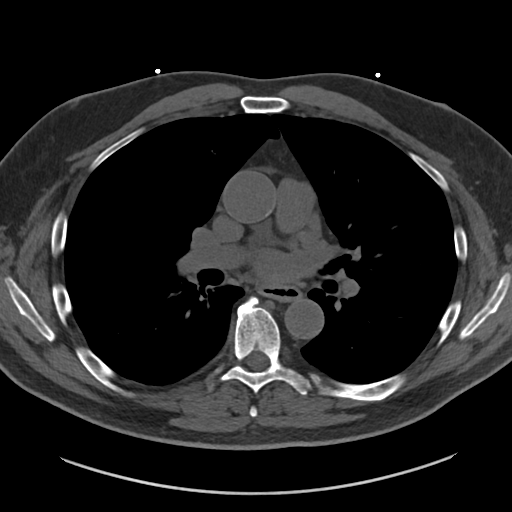
[im 38/46  lung]
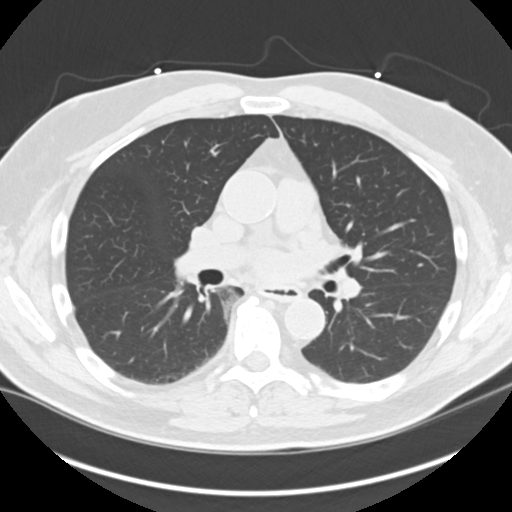

[Series 10: full fov lungs calcium scoring 3.00 ax · axial · 0.67mm/px · z∈[-1121,-1031]mm · 5 of 46 slices shown]
[im 8/46  vessel]
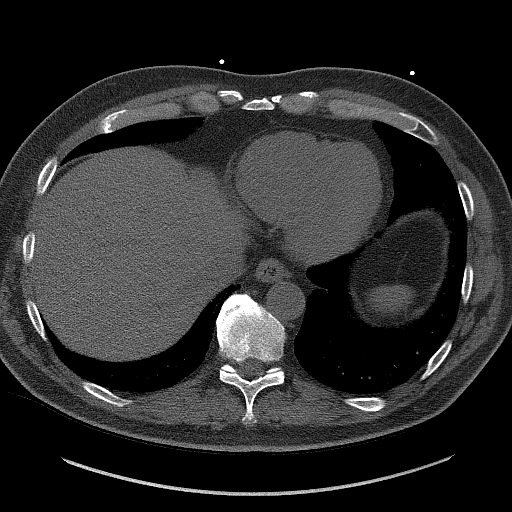
[im 16/46  vessel]
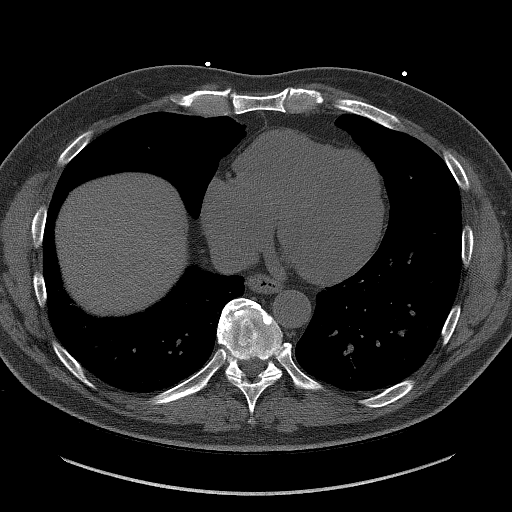
[im 23/46  vessel]
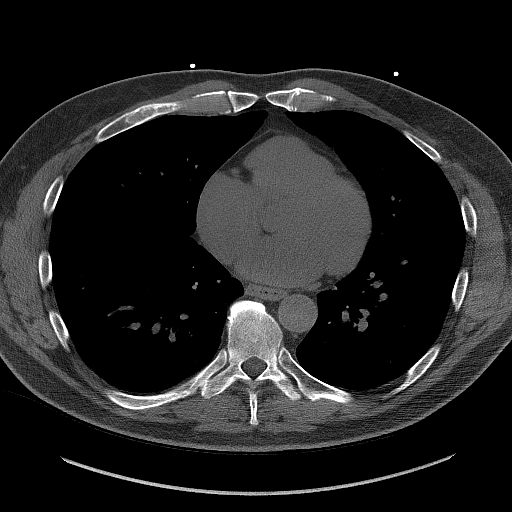
[im 31/46  vessel]
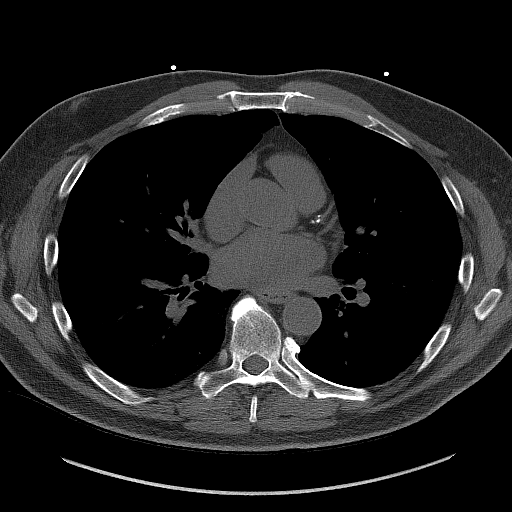
[im 38/46  vessel]
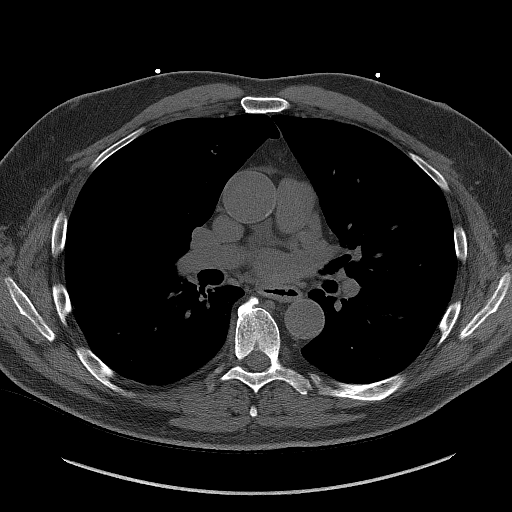

[14 of 20 positions shown; findings below may reference images not displayed]

FINDINGS: Non-cardiac: See separate report from [REDACTED].

Ascending Aorta: Normal size

Pericardium: Normal

Coronary arteries: Normal origin of left and right coronary
arteries. Distribution of arterial calcifications if present, as
noted below;

LM 0

LAD 220

LCx

RCA 0

Total 229

IMPRESSION AND RECOMMENDATION:
1. Coronary calcium score of 229. This was 74th percentile for age
and sex matched control.

2.  CAC 100-299 in LAD, LCx. DAISY TIGER2/N2.

3.  Continue heart healthy lifestyle and risk factor modification.

4.  Recommend aspirin and statin if no contraindications.

Caifeng Ellex

EXAM:
OVER-READ INTERPRETATION  CT CHEST

The following report is an over-read performed by radiologist Dr.
Edber Michell [REDACTED] on 05/20/2021. This over-read
does not include interpretation of cardiac or coronary anatomy or
pathology. The calcium score interpretation by the cardiologist is
attached.
FINDINGS: Vascular: Aortic atherosclerosis.

Mediastinum/Nodes: No imaged thoracic adenopathy.

Lungs/Pleura: No pleural fluid.  Clear imaged lungs.

Upper Abdomen: Normal imaged portions of the spleen, stomach. Tiny
cyst in segment 2 of the liver.

Musculoskeletal: Moderate thoracic spondylosis. Mild right
hemidiaphragm elevation.
IMPRESSION: 1. No acute process in the imaged extracardiac chest.
2. Aortic Atherosclerosis ([id number]).

*** End of Addendum ***
FINDINGS: Non-cardiac: See separate report from [REDACTED].

Ascending Aorta: Normal size

Pericardium: Normal

Coronary arteries: Normal origin of left and right coronary
arteries. Distribution of arterial calcifications if present, as
noted below;

LM 0

LAD 220

LCx

RCA 0

Total 229

IMPRESSION AND RECOMMENDATION:
1. Coronary calcium score of 229. This was 74th percentile for age
and sex matched control.

2.  CAC 100-299 in LAD, LCx. DAISY TIGER2/N2.

3.  Continue heart healthy lifestyle and risk factor modification.

4.  Recommend aspirin and statin if no contraindications.

Caifeng Ellex

## 2023-03-16 ENCOUNTER — Encounter: Payer: Self-pay | Admitting: Family Medicine

## 2023-03-16 ENCOUNTER — Ambulatory Visit (INDEPENDENT_AMBULATORY_CARE_PROVIDER_SITE_OTHER): Payer: Medicare PPO | Admitting: Family Medicine

## 2023-03-16 VITALS — BP 118/62 | HR 58 | Resp 97 | Ht 72.0 in | Wt 204.0 lb

## 2023-03-16 DIAGNOSIS — L821 Other seborrheic keratosis: Secondary | ICD-10-CM | POA: Diagnosis not present

## 2023-03-16 DIAGNOSIS — E782 Mixed hyperlipidemia: Secondary | ICD-10-CM | POA: Diagnosis not present

## 2023-03-16 DIAGNOSIS — Z0000 Encounter for general adult medical examination without abnormal findings: Secondary | ICD-10-CM

## 2023-03-16 NOTE — Progress Notes (Signed)
Date:  03/16/2023   Name:  Eugene Kramer   DOB:  Apr 08, 1956   MRN:  [id number]   Chief Complaint: Annual Exam  Patient is a 66 year old male who presents for a comprehensive physical exam. The patient reports the following problems: none. Health maintenance has been reviewed up to date.      Lab Results  Component Value Date   NA 144 10/24/2022   K 4.1 10/24/2022   CO2 25 10/24/2022   GLUCOSE 76 10/24/2022   BUN 14 10/24/2022   CREATININE 0.98 10/24/2022   CALCIUM 8.9 10/24/2022   EGFR 86 10/24/2022   GFRNONAA 76 03/10/2020   Lab Results  Component Value Date   CHOL 173 03/14/2022   HDL 41 03/14/2022   LDLCALC 103 (H) 03/14/2022   TRIG 166 (H) 03/14/2022   CHOLHDL 6.3 (H) 04/30/2021   No results found for: "TSH" No results found for: "HGBA1C" Lab Results  Component Value Date   WBC 8.2 10/24/2022   HGB 14.1 10/24/2022   HCT 41.1 10/24/2022   MCV 89 10/24/2022   PLT 183 10/24/2022   Lab Results  Component Value Date   ALT 23 03/14/2022   AST 27 03/14/2022   ALKPHOS 103 03/14/2022   BILITOT 0.6 03/14/2022   No results found for: "25OHVITD2", "25OHVITD3", "VD25OH"   Review of Systems  Constitutional:  Negative for chills and fever.  HENT:  Negative for drooling, ear discharge, ear pain, sore throat and trouble swallowing.   Respiratory:  Negative for cough, shortness of breath and wheezing.   Cardiovascular:  Negative for chest pain, palpitations and leg swelling.  Gastrointestinal:  Negative for abdominal pain, blood in stool, constipation, diarrhea and nausea.  Endocrine: Negative for polydipsia and polyuria.  Genitourinary:  Negative for dysuria, frequency, hematuria and urgency.  Musculoskeletal:  Negative for back pain, myalgias and neck pain.  Skin:  Negative for color change and rash.  Allergic/Immunologic: Negative for environmental allergies.  Neurological:  Negative for dizziness and headaches.  Hematological:  Negative for adenopathy.  Does not bruise/bleed easily.  Psychiatric/Behavioral:  Negative for suicidal ideas. The patient is not nervous/anxious.     There are no problems to display for this patient.   Allergies  Allergen Reactions   Erythromycin Swelling    Past Surgical History:  Procedure Laterality Date   COLONOSCOPY  10/25/2014   Dr Servando Snare   COLONOSCOPY WITH PROPOFOL N/A 04/30/2021   Procedure: COLONOSCOPY WITH PROPOFOL;  Surgeon: Midge Minium, MD;  Location: [facility];  Service: Endoscopy;  Laterality: N/A;   HERNIA REPAIR     POLYPECTOMY  04/30/2021   Procedure: POLYPECTOMY;  Surgeon: Midge Minium, MD;  Location: [facility];  Service: Endoscopy;;   TONSILLECTOMY AND ADENOIDECTOMY      Social History   Tobacco Use   Smoking status: Former    Current packs/day: 0.00    Types: Cigarettes    Quit date: 1987    Years since quitting: 37.5   Smokeless tobacco: Current    Types: Snuff  Vaping Use   Vaping status: Never Used  Substance Use Topics   Alcohol use: Yes    Comment: rare   Drug use: No     Medication list has been reviewed and updated.  No outpatient medications have been marked as taking for the 03/16/23 encounter (Office Visit) with Duanne Limerick, MD.   Current Facility-Administered Medications for the 03/16/23 encounter (Office Visit) with Duanne Limerick, MD  Medication   albuterol (PROVENTIL) (2.5 MG/3ML) 0.083% nebulizer solution 2.5 mg       03/16/2023    8:48 AM 10/24/2022    2:57 PM 03/14/2022    8:41 AM 03/11/2021    8:50 AM  GAD 7 : Generalized Anxiety Score  Nervous, Anxious, on Edge 0 0 0 0  Control/stop worrying 0 0 0 0  Worry too much - different things 0 0 0 0  Trouble relaxing 0 0 0 0  Restless 0 0 0 0  Easily annoyed or irritable 0 0 0 0  Afraid - awful might happen 0 0 0 0  Total GAD 7 Score 0 0 0 0  Anxiety Difficulty Not difficult at all Not difficult at all Not difficult at all        03/16/2023    8:48 AM 10/24/2022    2:57 PM  03/14/2022    8:41 AM  Depression screen PHQ 2/9  Decreased Interest 0 0 0  Down, Depressed, Hopeless 0 0 0  PHQ - 2 Score 0 0 0  Altered sleeping 0 0 0  Tired, decreased energy 0 0 0  Change in appetite 0 0 0  Feeling bad or failure about yourself  0 0 0  Trouble concentrating 0 0 0  Moving slowly or fidgety/restless 0 0 0  Suicidal thoughts 0 0 0  PHQ-9 Score 0 0 0  Difficult doing work/chores Not difficult at all Not difficult at all Not difficult at all    BP Readings from Last 3 Encounters:  03/16/23 118/62  10/24/22 108/60  03/14/22 116/78    Physical Exam Vitals and nursing note reviewed.  Constitutional:      Appearance: Normal appearance. He is well-developed and well-groomed.  HENT:     Head: Normocephalic.     Jaw: There is normal jaw occlusion.     Right Ear: Hearing, tympanic membrane, ear canal and external ear normal.     Left Ear: Hearing, tympanic membrane, ear canal and external ear normal.     Nose: Nose normal.     Mouth/Throat:     Lips: Pink.     Mouth: Mucous membranes are moist.     Dentition: Normal dentition. No dental tenderness.     Tongue: No lesions.     Palate: No mass.     Pharynx: Oropharynx is clear. Uvula midline. No pharyngeal swelling.  Eyes:     General: Lids are normal. Vision grossly intact. Gaze aligned appropriately. No scleral icterus.       Right eye: No discharge.        Left eye: No discharge.     Extraocular Movements: Extraocular movements intact.     Conjunctiva/sclera: Conjunctivae normal.     Pupils: Pupils are equal, round, and reactive to light.  Neck:     Thyroid: No thyroid mass, thyromegaly or thyroid tenderness.     Vascular: Normal carotid pulses. No carotid bruit, hepatojugular reflux or JVD.     Trachea: Trachea and phonation normal. No tracheal tenderness, abnormal tracheal secretions or tracheal deviation.  Cardiovascular:     Rate and Rhythm: Normal rate and regular rhythm.     Chest Wall: PMI is not  displaced. No thrill.     Pulses: Normal pulses. No decreased pulses.          Carotid pulses are 2+ on the right side and 2+ on the left side.      Radial pulses are 2+ on the right  side and 2+ on the left side.       Femoral pulses are 2+ on the right side and 2+ on the left side.      Popliteal pulses are 2+ on the right side and 2+ on the left side.       Dorsalis pedis pulses are 2+ on the right side and 2+ on the left side.       Posterior tibial pulses are 2+ on the right side and 2+ on the left side.     Heart sounds: Normal heart sounds, S1 normal and S2 normal. No murmur heard.    No systolic murmur is present.     No diastolic murmur is present.     No friction rub. No gallop. No S3 or S4 sounds.  Pulmonary:     Effort: No respiratory distress.     Breath sounds: Normal breath sounds. No decreased breath sounds, wheezing, rhonchi or rales.  Chest:  Breasts:    Breasts are symmetrical.     Right: Normal.     Left: Normal.  Abdominal:     General: Bowel sounds are normal.     Palpations: Abdomen is soft. There is no hepatomegaly, splenomegaly or mass.     Tenderness: There is no abdominal tenderness. There is no guarding or rebound.  Genitourinary:    Penis: Normal and uncircumcised.      Testes: Normal.        Right: Mass not present.        Left: Mass not present.     Prostate: Normal. Not enlarged and not tender.     Rectum: Normal. Guaiac result negative. No mass.  Musculoskeletal:        General: No tenderness. Normal range of motion.     Cervical back: Normal range of motion and neck supple.     Lumbar back: Normal.     Right lower leg: No edema.     Left lower leg: No edema.  Lymphadenopathy:     Head:     Right side of head: No submandibular adenopathy.     Left side of head: No submandibular adenopathy.     Cervical: No cervical adenopathy.     Right cervical: No superficial, deep or posterior cervical adenopathy.    Left cervical: No superficial, deep or  posterior cervical adenopathy.     Upper Body:     Right upper body: No supraclavicular or axillary adenopathy.     Left upper body: No supraclavicular or axillary adenopathy.  Skin:    General: Skin is warm.     Capillary Refill: Capillary refill takes less than 2 seconds.     Findings: No rash.     Comments: Multiple seb keratoses  Neurological:     Mental Status: He is alert and oriented to person, place, and time.     Cranial Nerves: Cranial nerves 2-12 are intact. No cranial nerve deficit.     Sensory: Sensation is intact.     Motor: Motor function is intact.     Coordination: Romberg sign negative.     Deep Tendon Reflexes: Reflexes are normal and symmetric.     Reflex Scores:      Tricep reflexes are 2+ on the right side and 2+ on the left side.      Bicep reflexes are 2+ on the right side and 2+ on the left side.      Brachioradialis reflexes are 2+ on the right side and 2+  on the left side.      Patellar reflexes are 2+ on the right side and 2+ on the left side.      Achilles reflexes are 2+ on the right side and 2+ on the left side. Psychiatric:        Behavior: Behavior is cooperative.     Wt Readings from Last 3 Encounters:  03/16/23 204 lb (92.5 kg)  10/24/22 217 lb (98.4 kg)  03/14/22 208 lb (94.3 kg)    BP 118/62   Pulse (!) 58   Resp (!) 97   Ht 6' (1.829 m)   Wt 204 lb (92.5 kg)   BMI 27.67 kg/m   Assessment and Plan: Eugene Kramer is a 67 y.o. male who presents today for his Complete Annual Exam. He feels well. He reports exercising manual work/daily wt lifting. He reports he is sleeping fairly well.  Immunizations are reviewed and recommendations provided.   Age appropriate screening tests are discussed. Counseling given for risk factor reduction interventions.  1. Annual physical exam No subjective/objective concerns noted during HPI, review of past medical history, review of systems and physical exam.  Will check lipid panel for current level of  LDL control as well as PSA.  Review of BMP within normal limits.  DRE was normal and PSA was obtained. - Lipid Panel With LDL/HDL Ratio - PSA  2. Mixed hyperlipidemia Chronic.  Controlled.  Stable.  Currently controlled with dietary management.  Will check lipid panel for current level of control. - Lipid Panel With LDL/HDL Ratio  3. Seborrheic keratoses Multiple seborrheic keratoses but there is a cheek area that may need further evaluate evaluation by dermatology and referral has been placed with Dr. Cheree Ditto who has seen patient previously. - Ambulatory referral to Dermatology    Elizabeth Sauer, MD

## 2023-03-17 LAB — PSA: Prostate Specific Ag, Serum: 0.7 ng/mL (ref 0.0–4.0)

## 2023-03-17 LAB — LIPID PANEL WITH LDL/HDL RATIO
Cholesterol, Total: 131 mg/dL (ref 100–199)
HDL: 37 mg/dL — ABNORMAL LOW (ref >39)
LDL Chol Calc (NIH): 68 mg/dL (ref 0–99)
LDL/HDL Ratio: 1.8 ratio (ref 0.0–3.6)
Triglycerides: 151 mg/dL — ABNORMAL HIGH (ref 0–149)
VLDL Cholesterol Cal: 26 mg/dL (ref 5–40)

## 2023-03-19 ENCOUNTER — Encounter: Payer: Self-pay | Admitting: Family Medicine

## 2023-03-20 ENCOUNTER — Other Ambulatory Visit: Payer: Self-pay

## 2023-03-20 DIAGNOSIS — L821 Other seborrheic keratosis: Secondary | ICD-10-CM

## 2023-03-20 NOTE — Progress Notes (Unsigned)
Ref placed to [facility]/ derm

## 2023-05-27 DIAGNOSIS — R0602 Shortness of breath: Secondary | ICD-10-CM | POA: Diagnosis not present

## 2023-05-27 DIAGNOSIS — J9811 Atelectasis: Secondary | ICD-10-CM | POA: Diagnosis not present

## 2023-05-27 DIAGNOSIS — E7800 Pure hypercholesterolemia, unspecified: Secondary | ICD-10-CM | POA: Diagnosis not present

## 2023-05-27 DIAGNOSIS — N429 Disorder of prostate, unspecified: Secondary | ICD-10-CM | POA: Diagnosis not present

## 2023-05-27 DIAGNOSIS — M47819 Spondylosis without myelopathy or radiculopathy, site unspecified: Secondary | ICD-10-CM | POA: Diagnosis not present

## 2023-05-27 DIAGNOSIS — R0781 Pleurodynia: Secondary | ICD-10-CM | POA: Diagnosis not present

## 2023-05-27 DIAGNOSIS — J45909 Unspecified asthma, uncomplicated: Secondary | ICD-10-CM | POA: Diagnosis not present

## 2023-05-27 DIAGNOSIS — K5730 Diverticulosis of large intestine without perforation or abscess without bleeding: Secondary | ICD-10-CM | POA: Diagnosis not present

## 2023-05-27 DIAGNOSIS — I700 Atherosclerosis of aorta: Secondary | ICD-10-CM | POA: Diagnosis not present

## 2023-05-27 DIAGNOSIS — R0789 Other chest pain: Secondary | ICD-10-CM | POA: Diagnosis not present

## 2023-05-27 DIAGNOSIS — K219 Gastro-esophageal reflux disease without esophagitis: Secondary | ICD-10-CM | POA: Diagnosis not present

## 2023-05-30 ENCOUNTER — Telehealth: Payer: Self-pay

## 2023-05-30 DIAGNOSIS — J4520 Mild intermittent asthma, uncomplicated: Secondary | ICD-10-CM

## 2023-05-30 MED ORDER — BUDESONIDE-FORMOTEROL FUMARATE 80-4.5 MCG/ACT IN AERO: 2.0000 | INHALATION_SPRAY | Freq: Every morning | RESPIRATORY_TRACT | 6 refills | Status: AC

## 2023-05-30 NOTE — Transitions of Care (Post Inpatient/ED Visit) (Signed)
05/30/2023  Name: Eugene Kramer MRN: [id number] DOB: 06-25-1956  Today's TOC FU Call Status: Today's TOC FU Call Status:: Successful TOC FU Call Completed TOC FU Call Complete Date: 05/30/23 Patient's Name and Date of Birth confirmed.  Transition Care Management Follow-up Telephone Call Date of Discharge: 05/27/23 Discharge Facility: Other (Non-[facility] Facility) Type of Discharge: Emergency Department Reason for ED Visit: Other: How have you been since you were released from the hospital?: Better Any questions or concerns?: No  Items Reviewed: Did you receive and understand the discharge instructions provided?: No Medications obtained,verified, and reconciled?: No Any new allergies since your discharge?: No Dietary orders reviewed?: No Do you have support at home?: No  Medications Reviewed Today: Medications Reviewed Today   Medications were not reviewed in this encounter     Home Care and Equipment/Supplies: Were Home Health Services Ordered?: No Any new equipment or medical supplies ordered?: No  Functional Questionnaire: Do you need assistance with bathing/showering or dressing?: No Do you need assistance with meal preparation?: No Do you need assistance with eating?: No Do you have difficulty maintaining continence: No Do you need assistance with getting out of bed/getting out of a chair/moving?: No Do you have difficulty managing or taking your medications?: No  Follow up appointments reviewed: PCP Follow-up appointment confirmed?: No MD Provider Line Number:[phone] Given: Yes Specialist Hospital Follow-up appointment confirmed?: No Do you need transportation to your follow-up appointment?: No Do you understand care options if your condition(s) worsen?: Yes-patient verbalized understanding    SIGNATURE

## 2023-07-31 DIAGNOSIS — I781 Nevus, non-neoplastic: Secondary | ICD-10-CM | POA: Diagnosis not present

## 2023-07-31 DIAGNOSIS — L570 Actinic keratosis: Secondary | ICD-10-CM | POA: Diagnosis not present

## 2023-07-31 DIAGNOSIS — L821 Other seborrheic keratosis: Secondary | ICD-10-CM | POA: Diagnosis not present

## 2023-08-14 DIAGNOSIS — E782 Mixed hyperlipidemia: Secondary | ICD-10-CM | POA: Diagnosis not present

## 2023-08-14 DIAGNOSIS — Z87891 Personal history of nicotine dependence: Secondary | ICD-10-CM | POA: Diagnosis not present

## 2023-08-14 DIAGNOSIS — R0789 Other chest pain: Secondary | ICD-10-CM | POA: Diagnosis not present

## 2023-08-14 DIAGNOSIS — J45909 Unspecified asthma, uncomplicated: Secondary | ICD-10-CM | POA: Diagnosis not present

## 2023-08-14 DIAGNOSIS — Z8249 Family history of ischemic heart disease and other diseases of the circulatory system: Secondary | ICD-10-CM | POA: Diagnosis not present

## 2023-10-23 ENCOUNTER — Encounter: Payer: Self-pay | Admitting: Family Medicine

## 2023-10-23 ENCOUNTER — Ambulatory Visit: Payer: Medicare PPO | Admitting: Family Medicine

## 2023-10-23 VITALS — BP 128/72 | HR 84 | Temp 98.5°F | Resp 16 | Ht 72.0 in | Wt 214.0 lb

## 2023-10-23 DIAGNOSIS — J4520 Mild intermittent asthma, uncomplicated: Secondary | ICD-10-CM | POA: Diagnosis not present

## 2023-10-23 DIAGNOSIS — J0100 Acute maxillary sinusitis, unspecified: Secondary | ICD-10-CM

## 2023-10-23 DIAGNOSIS — R051 Acute cough: Secondary | ICD-10-CM | POA: Diagnosis not present

## 2023-10-23 DIAGNOSIS — F419 Anxiety disorder, unspecified: Secondary | ICD-10-CM | POA: Diagnosis not present

## 2023-10-23 LAB — POCT INFLUENZA A/B
Influenza A, POC: NEGATIVE
Influenza B, POC: NEGATIVE

## 2023-10-23 LAB — POC COVID19 BINAXNOW: SARS Coronavirus 2 Ag: NEGATIVE

## 2023-10-23 MED ORDER — AMOXICILLIN-POT CLAVULANATE 875-125 MG PO TABS: 1.0000 | ORAL_TABLET | Freq: Two times a day (BID) | ORAL | 0 refills | Status: DC

## 2023-10-23 MED ORDER — BUSPIRONE HCL 7.5 MG PO TABS: 7.5000 mg | ORAL_TABLET | Freq: Two times a day (BID) | ORAL | 5 refills | Status: DC

## 2023-10-23 NOTE — Progress Notes (Signed)
Date:  10/23/2023   Name:  Eugene Kramer   DOB:  10-03-1955   MRN:  [id number]   Chief Complaint: Nasal Congestion (Green mucus and SOB )  Sinusitis This is a new problem. The current episode started in the past 7 days. The problem has been waxing and waning since onset. There has been no fever. Associated symptoms include chills, congestion and coughing. Pertinent negatives include no diaphoresis, ear pain, headaches, hoarse voice, neck pain, shortness of breath, sinus pressure, sneezing, sore throat or swollen glands. Treatments tried: nsaid.  Anxiety Presents for follow-up visit. Symptoms include excessive worry, hyperventilation, irritability, nervous/anxious behavior and restlessness. Patient reports no chest pain, depressed mood, palpitations, panic, shortness of breath or suicidal ideas. Symptoms occur occasionally. The severity of symptoms is mild.      Lab Results  Component Value Date   NA 144 10/24/2022   K 4.1 10/24/2022   CO2 25 10/24/2022   GLUCOSE 76 10/24/2022   BUN 14 10/24/2022   CREATININE 0.98 10/24/2022   CALCIUM 8.9 10/24/2022   EGFR 86 10/24/2022   GFRNONAA 76 03/10/2020   Lab Results  Component Value Date   CHOL 131 03/16/2023   HDL 37 (L) 03/16/2023   LDLCALC 68 03/16/2023   TRIG 151 (H) 03/16/2023   CHOLHDL 6.3 (H) 04/30/2021   No results found for: "TSH" No results found for: "HGBA1C" Lab Results  Component Value Date   WBC 8.2 10/24/2022   HGB 14.1 10/24/2022   HCT 41.1 10/24/2022   MCV 89 10/24/2022   PLT 183 10/24/2022   Lab Results  Component Value Date   ALT 23 03/14/2022   AST 27 03/14/2022   ALKPHOS 103 03/14/2022   BILITOT 0.6 03/14/2022   No results found for: "25OHVITD2", "25OHVITD3", "VD25OH"   Review of Systems  Constitutional:  Positive for chills and irritability. Negative for diaphoresis and fever.  HENT:  Positive for congestion. Negative for ear pain, hoarse voice, sinus pressure, sneezing and sore throat.    Respiratory:  Positive for cough. Negative for chest tightness, shortness of breath, wheezing and stridor.        Mild doe  Cardiovascular:  Negative for chest pain, palpitations and leg swelling.  Musculoskeletal:  Positive for arthralgias. Negative for neck pain.  Neurological:  Negative for headaches.  Psychiatric/Behavioral:  Negative for suicidal ideas. The patient is nervous/anxious.     There are no active problems to display for this patient.   Allergies  Allergen Reactions   Erythromycin Swelling    Past Surgical History:  Procedure Laterality Date   COLONOSCOPY  10/25/2014   Dr Servando Snare   COLONOSCOPY WITH PROPOFOL N/A 04/30/2021   Procedure: COLONOSCOPY WITH PROPOFOL;  Surgeon: Midge Minium, MD;  Location: [facility];  Service: Endoscopy;  Laterality: N/A;   HERNIA REPAIR     POLYPECTOMY  04/30/2021   Procedure: POLYPECTOMY;  Surgeon: Midge Minium, MD;  Location: [facility];  Service: Endoscopy;;   TONSILLECTOMY AND ADENOIDECTOMY      Social History   Tobacco Use   Smoking status: Former    Current packs/day: 0.00    Types: Cigarettes    Quit date: 1987    Years since quitting: 38.1   Smokeless tobacco: Current    Types: Snuff  Vaping Use   Vaping status: Never Used  Substance Use Topics   Alcohol use: Yes    Comment: rare   Drug use: No     Medication list has been  reviewed and updated.  Current Meds  Medication Sig   albuterol (VENTOLIN HFA) 108 (90 Base) MCG/ACT inhaler Inhale 2 puffs into the lungs every 6 (six) hours as needed for wheezing or shortness of breath.   APPLE CIDER VINEGAR PO Take 1 ampule by mouth daily.   budesonide-formoterol (SYMBICORT) 80-4.5 MCG/ACT inhaler Inhale 2 puffs into the lungs every morning.   Cinnamon 500 MG TABS Take 1 tablet by mouth daily.   ezetimibe (ZETIA) 10 MG tablet Take 10 mg by mouth daily.   fluticasone (FLONASE) 50 MCG/ACT nasal spray Place 1 spray into both nostrils daily.   Garlic 300 MG  CAPS Take 1 capsule by mouth daily.   ipratropium-albuterol (DUONEB) 0.5-2.5 (3) MG/3ML SOLN Take 3 mLs by nebulization every 6 (six) hours as needed.   Misc Natural Products (PROSTATE HEALTH) CAPS Take 1 capsule by mouth daily.   Omega-3 Fatty Acids (FISH OIL) 1000 MG CAPS Take 1 capsule by mouth daily.   tadalafil (CIALIS) 5 MG tablet Take 1 tablet (5 mg total) by mouth daily as needed for erectile dysfunction.   Current Facility-Administered Medications for the 10/23/23 encounter (Office Visit) with Duanne Limerick, MD  Medication   albuterol (PROVENTIL) (2.5 MG/3ML) 0.083% nebulizer solution 2.5 mg       03/16/2023    8:48 AM 10/24/2022    2:57 PM 03/14/2022    8:41 AM 03/11/2021    8:50 AM  GAD 7 : Generalized Anxiety Score  Nervous, Anxious, on Edge 0 0 0 0  Control/stop worrying 0 0 0 0  Worry too much - different things 0 0 0 0  Trouble relaxing 0 0 0 0  Restless 0 0 0 0  Easily annoyed or irritable 0 0 0 0  Afraid - awful might happen 0 0 0 0  Total GAD 7 Score 0 0 0 0  Anxiety Difficulty Not difficult at all Not difficult at all Not difficult at all        10/23/2023    8:37 AM 03/16/2023    8:48 AM 10/24/2022    2:57 PM  Depression screen PHQ 2/9  Decreased Interest 0 0 0  Down, Depressed, Hopeless 0 0 0  PHQ - 2 Score 0 0 0  Altered sleeping  0 0  Tired, decreased energy  0 0  Change in appetite  0 0  Feeling bad or failure about yourself   0 0  Trouble concentrating  0 0  Moving slowly or fidgety/restless  0 0  Suicidal thoughts  0 0  PHQ-9 Score  0 0  Difficult doing work/chores  Not difficult at all Not difficult at all    BP Readings from Last 3 Encounters:  10/23/23 128/72  03/16/23 118/62  10/24/22 108/60    Physical Exam Vitals and nursing note reviewed.  Constitutional:      Appearance: He is well-developed.  HENT:     Head: Normocephalic and atraumatic.     Right Ear: Tympanic membrane, ear canal and external ear normal.     Left Ear: Tympanic  membrane, ear canal and external ear normal.     Nose: Nose normal.     Mouth/Throat:     Mouth: Mucous membranes are moist.     Dentition: Normal dentition.  Eyes:     General: Lids are normal. No scleral icterus.    Conjunctiva/sclera: Conjunctivae normal.     Pupils: Pupils are equal, round, and reactive to light.  Neck:  Thyroid: No thyromegaly.     Vascular: No carotid bruit, hepatojugular reflux or JVD.     Trachea: No tracheal deviation.  Cardiovascular:     Rate and Rhythm: Normal rate and regular rhythm.     Heart sounds: Normal heart sounds, S1 normal and S2 normal.     No systolic murmur is present.     No diastolic murmur is present.     No gallop. No S3 or S4 sounds.  Pulmonary:     Effort: Pulmonary effort is normal.     Breath sounds: Normal breath sounds. No decreased breath sounds, wheezing, rhonchi or rales.  Abdominal:     General: Bowel sounds are normal.     Palpations: Abdomen is soft. There is no hepatomegaly, splenomegaly or mass.     Tenderness: There is no abdominal tenderness.     Hernia: There is no hernia in the left inguinal area.  Musculoskeletal:        General: Normal range of motion.     Cervical back: Normal range of motion and neck supple.  Lymphadenopathy:     Cervical: No cervical adenopathy.  Skin:    General: Skin is warm and dry.     Findings: No rash.  Neurological:     Mental Status: He is alert and oriented to person, place, and time.     Sensory: No sensory deficit.     Deep Tendon Reflexes: Reflexes are normal and symmetric.  Psychiatric:        Mood and Affect: Mood is not anxious or depressed.     Wt Readings from Last 3 Encounters:  10/23/23 214 lb (97.1 kg)  03/16/23 204 lb (92.5 kg)  10/24/22 217 lb (98.4 kg)    BP 128/72   Pulse 84   Temp 98.5 F (36.9 C) (Oral)   Resp 16   Ht 6' (1.829 m)   Wt 214 lb (97.1 kg)   SpO2 96%   BMI 29.02 kg/m   Assessment and Plan:  1. Acute maxillary sinusitis,  recurrence not specified (Primary) New onset.  Persistent.  Stable.  Patient has had purulent sputum which is likely postnasal involving upper respiratory as well.  We will treat with Augmentin 875 mg twice a day for 10 days. - amoxicillin-clavulanate (AUGMENTIN) 875-125 MG tablet; Take 1 tablet by mouth 2 (two) times daily.  Dispense: 20 tablet; Refill: 0  2. Anxiety New onset.  Patient has "a lot of things going on "that is affecting his anxiety with restlessness, irritability, excessive worry, and nervousness.  Patient does have a tremor but I think is due to his bronchodilators that he is currently maximizing because of his cough.  We will treat with buspirone 7.5 mg twice a day that he has tolerated in the past and will continue at current dosing unless patient calls and we need to consider increasing. - busPIRone (BUSPAR) 7.5 MG tablet; Take 1 tablet (7.5 mg total) by mouth 2 (two) times daily.  Dispense: 60 tablet; Refill: 5  3. Acute cough New onset.  Episodic.  Patient without at rest shortness of breath but there is some DOE which is probably secondary to his reactivity of his airways.  Will encouraged to continue with Symbicort and albuterol rescue.  Patient has been instructed to treat cough with Mucinex DM.  4. Mild intermittent asthma without complication Chronic.  Episodic.  Intermittent that is mild.  Currently being treated with Symbicort and albuterol and have encouraged to continue to do so  at this time.   Elizabeth Sauer, MD

## 2023-10-23 NOTE — Addendum Note (Signed)
Addended by: Mariel Sleet on: 10/23/2023 09:14 AM   Modules accepted: Orders

## 2023-12-07 ENCOUNTER — Other Ambulatory Visit: Payer: Self-pay

## 2023-12-07 ENCOUNTER — Encounter: Payer: Self-pay | Admitting: Family Medicine

## 2023-12-07 DIAGNOSIS — N529 Male erectile dysfunction, unspecified: Secondary | ICD-10-CM

## 2023-12-07 MED ORDER — TADALAFIL 5 MG PO TABS: 5.0000 mg | ORAL_TABLET | Freq: Every day | ORAL | 1 refills | Status: DC | PRN

## 2024-03-20 ENCOUNTER — Encounter: Payer: Self-pay | Admitting: Physician Assistant

## 2024-03-20 ENCOUNTER — Ambulatory Visit (INDEPENDENT_AMBULATORY_CARE_PROVIDER_SITE_OTHER): Admitting: Physician Assistant

## 2024-03-20 VITALS — BP 134/82 | HR 69 | Temp 98.0°F | Ht 72.0 in | Wt 211.0 lb

## 2024-03-20 DIAGNOSIS — Z23 Encounter for immunization: Secondary | ICD-10-CM

## 2024-03-20 DIAGNOSIS — N529 Male erectile dysfunction, unspecified: Secondary | ICD-10-CM | POA: Insufficient documentation

## 2024-03-20 DIAGNOSIS — K219 Gastro-esophageal reflux disease without esophagitis: Secondary | ICD-10-CM | POA: Insufficient documentation

## 2024-03-20 DIAGNOSIS — Z0000 Encounter for general adult medical examination without abnormal findings: Secondary | ICD-10-CM

## 2024-03-20 DIAGNOSIS — J4520 Mild intermittent asthma, uncomplicated: Secondary | ICD-10-CM | POA: Insufficient documentation

## 2024-03-20 DIAGNOSIS — Z860101 Personal history of adenomatous and serrated colon polyps: Secondary | ICD-10-CM | POA: Insufficient documentation

## 2024-03-20 DIAGNOSIS — E782 Mixed hyperlipidemia: Secondary | ICD-10-CM | POA: Insufficient documentation

## 2024-03-20 DIAGNOSIS — Z125 Encounter for screening for malignant neoplasm of prostate: Secondary | ICD-10-CM | POA: Diagnosis not present

## 2024-03-20 MED ORDER — TADALAFIL 5 MG PO TABS: 5.0000 mg | ORAL_TABLET | Freq: Every day | ORAL | 2 refills | Status: AC | PRN

## 2024-03-20 NOTE — Progress Notes (Signed)
 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 Joshua.  Overall he is fairly healthy, remains active on his farm, has no particular complaints today.  Requests refill on tadalafil  which he uses as needed.  Last Physical: 03/16/23 Last Dental Exam: 30y ago Last Eye Exam: >5y ago Last CRC screen: 04/30/21 tubular adenoma, f/u 7y, father died of colon cancer Last PSA: 1y ago, 0.7 Immunizations Due: Tdap, Shingrix, Prevnar 20  Patient suspects on two separate occasional he felt terribly ill shortly following DRE/prostate exam. It is suspected these were episodes of acute prostatitis, so he has since declined prostate exams but checks PSA regularly.   Medication list has been reviewed and updated.  Current Meds  Medication Sig   albuterol  (VENTOLIN  HFA) 108 (90 Base) MCG/ACT inhaler Inhale 2 puffs into the lungs every 6 (six) hours as needed for wheezing or shortness of breath.   APPLE CIDER VINEGAR PO Take 1 ampule by mouth daily.   budesonide -formoterol  (SYMBICORT ) 80-4.5 MCG/ACT inhaler Inhale 2 puffs into the lungs every morning.   Cinnamon 500 MG TABS Take 1 tablet by mouth daily.   ezetimibe (ZETIA) 10 MG tablet Take 10 mg by mouth daily.   fluticasone  (FLONASE ) 50 MCG/ACT nasal spray Place 1 spray into both nostrils daily.   Garlic 300 MG CAPS Take 1 capsule by mouth daily.   ipratropium-albuterol  (DUONEB) 0.5-2.5 (3) MG/3ML SOLN Take 3 mLs by nebulization every 6 (six) hours as needed.   Misc Natural Products (PROSTATE HEALTH) CAPS Take 1 capsule by mouth daily.   Omega-3 Fatty Acids (FISH OIL) 1000 MG CAPS Take 1 capsule by mouth daily.   omeprazole  (PRILOSEC) 40 MG capsule Take 1 capsule (40 mg total) by mouth daily. (Patient taking differently: Take 40 mg by mouth as needed.)   rosuvastatin   (CRESTOR ) 5 MG tablet Take 0.5 tablets (2.5 mg total) by mouth daily. callwood   [DISCONTINUED] busPIRone  (BUSPAR ) 7.5 MG tablet Take 1 tablet (7.5 mg total) by mouth 2 (two) times daily.   [DISCONTINUED] tadalafil  (CIALIS ) 5 MG tablet Take 1 tablet (5 mg total) by mouth daily as needed for erectile dysfunction.   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   Mild intermittent asthma without complication 03/20/2024   Gastroesophageal reflux disease without esophagitis 03/20/2024   History of adenomatous polyp of colon 03/20/2024    Allergies  Allergen Reactions   Erythromycin Swelling and Other (See Comments)   Tamsulosin Other (See Comments)    Near-syncope    Immunization History  Administered Date(s) Administered   Moderna Sars-Covid-2 Vaccination 11/15/2019, 12/13/2019, 07/24/2020   Tdap 03/20/2024    Past Surgical History:  Procedure Laterality Date   COLONOSCOPY  10/25/2014   Dr Jinny   COLONOSCOPY WITH PROPOFOL  N/A 04/30/2021   Procedure: COLONOSCOPY WITH PROPOFOL ;  Surgeon: Jinny Carmine, MD;  Location: [facility];  Service: Endoscopy;  Laterality: N/A;   HERNIA REPAIR     POLYPECTOMY  04/30/2021   Procedure: POLYPECTOMY;  Surgeon:  Jinny Carmine, MD;  Location: [facility];  Service: Endoscopy;;   TONSILLECTOMY AND ADENOIDECTOMY      Social History   Tobacco Use   Smoking status: Former    Current packs/day: 0.00    Types: Cigarettes    Quit date: 1987    Years since quitting: 38.5   Smokeless tobacco: Current    Types: Snuff  Vaping Use   Vaping status: Never Used  Substance Use Topics   Alcohol use: Yes    Alcohol/week: 10.0 standard  drinks of alcohol    Types: 10 Cans of beer per week    Comment: rare   Drug use: No    Family History  Problem Relation Age of Onset   Arthritis Father    Colon cancer Father    Heart disease Father    Hypertension Father    Stroke Father         03/20/2024    8:06 AM 03/16/2023    8:48 AM 10/24/2022    2:57 PM 03/14/2022    8:41 AM  GAD 7 : Generalized Anxiety Score  Nervous, Anxious, on Edge 0 0 0 0  Control/stop worrying 0 0 0 0  Worry too much - different things 0 0 0 0  Trouble relaxing 0 0 0 0  Restless 0 0 0 0  Easily annoyed or irritable 0 0 0 0  Afraid - awful might happen 0 0 0 0  Total GAD 7 Score 0 0 0 0  Anxiety Difficulty Not difficult at all Not difficult at all Not difficult at all Not difficult at all       03/20/2024    8:06 AM 10/23/2023    8:37 AM 03/16/2023    8:48 AM  Depression screen PHQ 2/9  Decreased Interest 0 0 0  Down, Depressed, Hopeless 0 0 0  PHQ - 2 Score 0 0 0  Altered sleeping   0  Tired, decreased energy   0  Change in appetite   0  Feeling bad or failure about yourself    0  Trouble concentrating   0  Moving slowly or fidgety/restless   0  Suicidal thoughts   0  PHQ-9 Score   0  Difficult doing work/chores   Not difficult at all    BP Readings from Last 3 Encounters:  03/20/24 134/82  10/23/23 128/72  03/16/23 118/62    Wt Readings from Last 3 Encounters:  03/20/24 211 lb (95.7 kg)  10/23/23 214 lb (97.1 kg)  03/16/23 204 lb (92.5 kg)    BP 134/82   Pulse 69   Temp 98 F (36.7 C)   Ht 6' (1.829 m)   Wt 211 lb (95.7 kg)   SpO2 96%   BMI 28.62 kg/m   Physical Exam Vitals and nursing note reviewed.  Constitutional:      Appearance: Normal appearance.  HENT:     Right Ear: Ear canal normal. Tympanic membrane is scarred.     Left Ear: Ear canal normal. Tympanic membrane is scarred.     Nose: Nose normal.     Mouth/Throat:     Mouth: Mucous membranes are moist. No oral lesions.     Dentition: Normal  dentition.     Pharynx: No posterior oropharyngeal erythema.  Eyes:     Extraocular Movements: Extraocular movements intact.     Conjunctiva/sclera: Conjunctivae normal.     Pupils: Pupils are equal, round, and reactive to light.  Neck:  Thyroid: No thyromegaly.  Cardiovascular:     Rate and Rhythm: Normal rate and regular rhythm.     Heart sounds: No murmur heard.    No friction rub. No gallop.     Comments: Pulses 2+ at radial, PT, DP bilaterally. No carotid bruit. No peripheral edema Pulmonary:     Effort: Pulmonary effort is normal.     Breath sounds: Normal breath sounds.  Abdominal:     General: Bowel sounds are normal.     Palpations: Abdomen is soft. There is no mass.     Tenderness: There is no abdominal tenderness.  Genitourinary:    Comments: Genital/rectal exam deferred. Reviewed technique for testicular self-exam. Musculoskeletal:     Comments: Full ROM with strength 5/5 bilateral upper and lower extremities  Lymphadenopathy:     Cervical: No cervical adenopathy.  Skin:    General: Skin is warm.     Capillary Refill: Capillary refill takes less than 2 seconds.     Findings: No lesion or rash.  Neurological:     Mental Status: He is alert and oriented to person, place, and time.     Gait: Gait is intact.  Psychiatric:        Mood and Affect: Mood normal.        Behavior: Behavior normal.     Recent Labs     Component Value Date/Time   NA 144 10/24/2022 1553   K 4.1 10/24/2022 1553   CL 106 10/24/2022 1553   CO2 25 10/24/2022 1553   GLUCOSE 76 10/24/2022 1553   BUN 14 10/24/2022 1553   CREATININE 0.98 10/24/2022 1553   CALCIUM  8.9 10/24/2022 1553   PROT 6.7 03/14/2022 0941   ALBUMIN 4.5 03/14/2022 0941   AST 27 03/14/2022 0941   ALT 23 03/14/2022 0941   ALKPHOS 103 03/14/2022 0941   BILITOT 0.6 03/14/2022 0941   GFRNONAA 76 03/10/2020 0959   GFRAA 88 03/10/2020 0959    Lab Results  Component Value Date   WBC 8.2 10/24/2022   HGB 14.1  10/24/2022   HCT 41.1 10/24/2022   MCV 89 10/24/2022   PLT 183 10/24/2022   No results found for: HGBA1C Lab Results  Component Value Date   CHOL 131 03/16/2023   HDL 37 (L) 03/16/2023   LDLCALC 68 03/16/2023   TRIG 151 (H) 03/16/2023   CHOLHDL 6.3 (H) 04/30/2021   No results found for: TSH   Assessment and Plan:  1. Annual physical exam (Primary) Encouraged healthy lifestyle including regular physical activity and consumption of whole fruits and vegetables. Encouraged routine dental and eye exams.  - CBC with Differential/Platelet - Comprehensive metabolic panel with GFR - TSH  2. Vasculogenic erectile dysfunction, unspecified vasculogenic erectile dysfunction type - tadalafil  (CIALIS ) 5 MG tablet; Take 1 tablet (5 mg total) by mouth daily as needed for erectile dysfunction.  Dispense: 30 tablet; Refill: 2  3. Mixed hyperlipidemia Check fasting lipids today - Lipid panel  4. Screening PSA (prostate specific antigen) - PSA  5. Encounter for immunization Patient accepted Tdap today, but deferred Prevnar and Shingrix - he will consider these.  - Tdap vaccine greater than or equal to 7yo IM    Return in about 6 months (around 09/20/2024) for OV f/u chronic conditions.    Rolan Hoyle, PA-C, DMSc, Nutritionist [facility] [phone]

## 2024-03-21 ENCOUNTER — Ambulatory Visit: Payer: Self-pay | Admitting: Physician Assistant

## 2024-03-21 LAB — CBC WITH DIFFERENTIAL/PLATELET
Basophils Absolute: 0.1 x10E3/uL (ref 0.0–0.2)
Basos: 1 %
EOS (ABSOLUTE): 0.4 x10E3/uL (ref 0.0–0.4)
Eos: 6 %
Hematocrit: 45.5 % (ref 37.5–51.0)
Hemoglobin: 15.3 g/dL (ref 13.0–17.7)
Immature Grans (Abs): 0 x10E3/uL (ref 0.0–0.1)
Immature Granulocytes: 0 %
Lymphocytes Absolute: 1.9 x10E3/uL (ref 0.7–3.1)
Lymphs: 27 %
MCH: 31.1 pg (ref 26.6–33.0)
MCHC: 33.6 g/dL (ref 31.5–35.7)
MCV: 93 fL (ref 79–97)
Monocytes Absolute: 0.6 x10E3/uL (ref 0.1–0.9)
Monocytes: 8 %
Neutrophils Absolute: 4.1 x10E3/uL (ref 1.4–7.0)
Neutrophils: 58 %
Platelets: 171 x10E3/uL (ref 150–450)
RBC: 4.92 x10E6/uL (ref 4.14–5.80)
RDW: 12.6 % (ref 11.6–15.4)
WBC: 7.1 x10E3/uL (ref 3.4–10.8)

## 2024-03-21 LAB — COMPREHENSIVE METABOLIC PANEL WITH GFR
ALT: 22 IU/L (ref 0–44)
AST: 27 IU/L (ref 0–40)
Albumin: 4.6 g/dL (ref 3.9–4.9)
Alkaline Phosphatase: 98 IU/L (ref 44–121)
BUN/Creatinine Ratio: 15 (ref 10–24)
BUN: 13 mg/dL (ref 8–27)
Bilirubin Total: 0.5 mg/dL (ref 0.0–1.2)
CO2: 23 mmol/L (ref 20–29)
Calcium: 9.4 mg/dL (ref 8.6–10.2)
Chloride: 103 mmol/L (ref 96–106)
Creatinine, Ser: 0.89 mg/dL (ref 0.76–1.27)
Globulin, Total: 1.9 g/dL (ref 1.5–4.5)
Glucose: 88 mg/dL (ref 70–99)
Potassium: 4.3 mmol/L (ref 3.5–5.2)
Sodium: 139 mmol/L (ref 134–144)
Total Protein: 6.5 g/dL (ref 6.0–8.5)
eGFR: 95 mL/min/1.73 (ref >59)

## 2024-03-21 LAB — LIPID PANEL
Chol/HDL Ratio: 3.4 ratio (ref 0.0–5.0)
Cholesterol, Total: 133 mg/dL (ref 100–199)
HDL: 39 mg/dL — ABNORMAL LOW (ref >39)
LDL Chol Calc (NIH): 75 mg/dL (ref 0–99)
Triglycerides: 102 mg/dL (ref 0–149)
VLDL Cholesterol Cal: 19 mg/dL (ref 5–40)

## 2024-03-21 LAB — PSA: Prostate Specific Ag, Serum: 0.8 ng/mL (ref 0.0–4.0)

## 2024-03-21 LAB — TSH: TSH: 2.87 u[IU]/mL (ref 0.450–4.500)

## 2024-04-09 ENCOUNTER — Ambulatory Visit: Payer: BC Managed Care – PPO | Admitting: Dermatology

## 2024-04-11 ENCOUNTER — Ambulatory Visit: Admitting: Dermatology

## 2024-05-22 ENCOUNTER — Ambulatory Visit

## 2024-09-24 ENCOUNTER — Ambulatory Visit: Admitting: Physician Assistant

## 2024-09-24 ENCOUNTER — Encounter: Payer: Self-pay | Admitting: Physician Assistant

## 2024-09-24 ENCOUNTER — Ambulatory Visit: Payer: Self-pay

## 2024-09-24 VITALS — BP 128/84 | HR 67 | Temp 98.0°F | Ht 72.0 in | Wt 218.0 lb

## 2024-09-24 DIAGNOSIS — E782 Mixed hyperlipidemia: Secondary | ICD-10-CM | POA: Diagnosis not present

## 2024-09-24 DIAGNOSIS — I2510 Atherosclerotic heart disease of native coronary artery without angina pectoris: Secondary | ICD-10-CM | POA: Diagnosis not present

## 2024-09-24 DIAGNOSIS — Z23 Encounter for immunization: Secondary | ICD-10-CM

## 2024-09-24 NOTE — Progress Notes (Signed)
 "   Date:  09/24/2024   Name:  Eugene Kramer   DOB:  1956/05/28   MRN:  [id number]   Chief Complaint: Medical Management of Chronic Issues  HPI  Eugene Kramer returns for 26-month f/u on chronic conditions including HLD, ED, and coronary atherosclerosis. He was recently seen by cardiology 08/15/2024 who recommended CTA with FFR due to elevated coronary artery calcium  score 74th percentile in 2022; per patient they will be waiting until the summer to complete this.    Carotid ultrasound was performed 09/06/24 revealing no hemodynamically significant stenosis. AAA screening was also completed 09/06/24 and negative.   Medication list has been reviewed and updated.  Active Medications[1]   Review of Systems  Patient Active Problem List   Diagnosis Date Noted   Mixed hyperlipidemia 03/20/2024   Vasculogenic erectile dysfunction 03/20/2024   Mild intermittent asthma without complication 03/20/2024   Gastroesophageal reflux disease without esophagitis 03/20/2024   History of adenomatous polyp of colon 03/20/2024    Allergies[2]  Immunization History  Administered Date(s) Administered   Moderna Sars-Covid-2 Vaccination 11/15/2019, 12/13/2019, 07/24/2020   Tdap 03/20/2024   Zoster Recombinant(Shingrix ) 09/24/2024    Past Surgical History:  Procedure Laterality Date   COLONOSCOPY  10/25/2014   Dr Eugene Kramer   COLONOSCOPY WITH PROPOFOL  N/A 04/30/2021   Procedure: COLONOSCOPY WITH PROPOFOL ;  Surgeon: Eugene Kramer Carmine, MD;  Location: [facility];  Service: Endoscopy;  Laterality: N/A;   HERNIA REPAIR     POLYPECTOMY  04/30/2021   Procedure: POLYPECTOMY;  Surgeon: Eugene Kramer Carmine, MD;  Location: [facility];  Service: Endoscopy;;   TONSILLECTOMY AND ADENOIDECTOMY      Social History[3]  Family History  Problem Relation Age of Onset   Arthritis Father    Colon cancer Father    Heart disease Father    Hypertension Father    Stroke Father         09/24/2024    8:03 AM 03/20/2024     8:06 AM 03/16/2023    8:48 AM 10/24/2022    2:57 PM  GAD 7 : Generalized Anxiety Score  Nervous, Anxious, on Edge 0 0  0  0   Control/stop worrying 0 0  0  0   Worry too much - different things 0 0  0  0   Trouble relaxing 0 0  0  0   Restless 0 0  0  0   Easily annoyed or irritable 0 0  0  0   Afraid - awful might happen 0 0  0  0   Total GAD 7 Score 0 0 0 0  Anxiety Difficulty Not difficult at all Not difficult at all Not difficult at all Not difficult at all     Data saved with a previous flowsheet row definition       09/24/2024    8:03 AM 03/20/2024    8:06 AM 10/23/2023    8:37 AM  Depression screen PHQ 2/9  Decreased Interest 0 0 0  Down, Depressed, Hopeless 0 0 0  PHQ - 2 Score 0 0 0    BP Readings from Last 3 Encounters:  09/24/24 128/84  03/20/24 134/82  10/23/23 128/72    Wt Readings from Last 3 Encounters:  09/24/24 218 lb (98.9 kg)  03/20/24 211 lb (95.7 kg)  10/23/23 214 lb (97.1 kg)    BP 128/84   Pulse 67   Temp 98 F (36.7 C)   Ht 6' (1.829 m)   Wt 218  lb (98.9 kg)   SpO2 96%   BMI 29.57 kg/m   Physical Exam Vitals and nursing note reviewed.  Constitutional:      Appearance: Normal appearance.  Cardiovascular:     Rate and Rhythm: Normal rate and regular rhythm.     Heart sounds: No murmur heard.    No friction rub. No gallop.  Pulmonary:     Effort: Pulmonary effort is normal.     Breath sounds: Normal breath sounds.  Abdominal:     General: There is no distension.  Musculoskeletal:        General: Normal range of motion.  Skin:    General: Skin is warm and dry.  Neurological:     Mental Status: He is alert and oriented to person, place, and time.     Gait: Gait is intact.  Psychiatric:        Mood and Affect: Mood and affect normal.     Recent Labs     Component Value Date/Time   NA 139 03/20/2024 0910   K 4.3 03/20/2024 0910   CL 103 03/20/2024 0910   CO2 23 03/20/2024 0910   GLUCOSE 88 03/20/2024 0910   BUN 13  03/20/2024 0910   CREATININE 0.89 03/20/2024 0910   CALCIUM  9.4 03/20/2024 0910   PROT 6.5 03/20/2024 0910   ALBUMIN 4.6 03/20/2024 0910   AST 27 03/20/2024 0910   ALT 22 03/20/2024 0910   ALKPHOS 98 03/20/2024 0910   BILITOT 0.5 03/20/2024 0910   GFRNONAA 76 03/10/2020 0959   GFRAA 88 03/10/2020 0959    Lab Results  Component Value Date   WBC 7.1 03/20/2024   HGB 15.3 03/20/2024   HCT 45.5 03/20/2024   MCV 93 03/20/2024   PLT 171 03/20/2024   No results found for: HGBA1C Lab Results  Component Value Date   CHOL 133 03/20/2024   HDL 39 (L) 03/20/2024   LDLCALC 75 03/20/2024   TRIG 102 03/20/2024   CHOLHDL 3.4 03/20/2024   Lab Results  Component Value Date   TSH 2.870 03/20/2024        Assessment & Plan Mixed hyperlipidemia Seems to be doing well with low-dose rosuvastatin  in combination with Zetia.  Last LDL from 03/20/2024 was 75, HDL 39, total 133.  Offered repeat labs today, but I do not feel strongly compelled to recheck at this time as it probably will not affect management.  Patient in agreement, plan to recheck at his physical with me in the summer.  Continue current therapy for now    Atherosclerosis of native coronary artery of native heart without angina pectoris Reviewed recent specialist notes, continue following with cardiology.    Encounter for immunization Shingrix  dose 1 administered today Orders:   Varicella-zoster vaccine IM    Follow-up with me as scheduled in July 2026 for fasting CPE   Rolan Hoyle, PA-C, DMSc, DipACLM, Nutritionist [place] [facility] [phone]      [1]  Current Meds  Medication Sig   albuterol  (VENTOLIN  HFA) 108 (90 Base) MCG/ACT inhaler Inhale 2 puffs into the lungs every 6 (six) hours as needed for wheezing or shortness of breath.   APPLE CIDER VINEGAR PO Take 1 ampule by mouth daily.   budesonide -formoterol  (SYMBICORT ) 80-4.5  MCG/ACT inhaler Inhale 2 puffs into the lungs every morning.   Cinnamon 500 MG TABS Take 1 tablet by mouth daily.   ezetimibe (ZETIA) 10 MG tablet Take  10 mg by mouth daily.   fluticasone  (FLONASE ) 50 MCG/ACT nasal spray Place 1 spray into both nostrils daily.   Garlic 300 MG CAPS Take 1 capsule by mouth daily.   ipratropium-albuterol  (DUONEB) 0.5-2.5 (3) MG/3ML SOLN Take 3 mLs by nebulization every 6 (six) hours as needed.   Misc Natural Products (PROSTATE HEALTH) CAPS Take 1 capsule by mouth daily.   Omega-3 Fatty Acids (FISH OIL) 1000 MG CAPS Take 1 capsule by mouth daily.   omeprazole  (PRILOSEC) 40 MG capsule Take 1 capsule (40 mg total) by mouth daily. (Patient taking differently: Take 40 mg by mouth as needed.)   rosuvastatin  (CRESTOR ) 5 MG tablet Take 0.5 tablets (2.5 mg total) by mouth daily. callwood   tadalafil  (CIALIS ) 5 MG tablet Take 1 tablet (5 mg total) by mouth daily as needed for erectile dysfunction.   Current Facility-Administered Medications for the 09/24/24 encounter (Office Visit) with Manya Toribio SQUIBB, PA  Medication   albuterol  (PROVENTIL ) (2.5 MG/3ML) 0.083% nebulizer solution 2.5 mg  [2]  Allergies Allergen Reactions   Erythromycin Swelling and Other (See Comments)   Tamsulosin Other (See Comments)    Near-syncope  [3]  Social History Tobacco Use   Smoking status: Former    Current packs/day: 0.00    Types: Cigarettes    Quit date: 1987    Years since quitting: 39.0   Smokeless tobacco: Current    Types: Snuff  Vaping Use   Vaping status: Never Used  Substance Use Topics   Alcohol use: Yes    Alcohol/week: 10.0 standard drinks of alcohol    Types: 10 Cans of beer per week    Comment: rare   Drug use: No   "

## 2024-09-24 NOTE — Assessment & Plan Note (Signed)
 Reviewed recent specialist notes, continue following with cardiology.

## 2024-09-24 NOTE — Assessment & Plan Note (Signed)
 Seems to be doing well with low-dose rosuvastatin  in combination with Zetia.  Last LDL from 03/20/2024 was 75, HDL 39, total 133.  Offered repeat labs today, but I do not feel strongly compelled to recheck at this time as it probably will not affect management.  Patient in agreement, plan to recheck at his physical with me in the summer.  Continue current therapy for now

## 2024-10-22 ENCOUNTER — Encounter: Payer: Medicare PPO | Admitting: Physician Assistant

## 2024-11-27 ENCOUNTER — Ambulatory Visit

## 2025-03-26 ENCOUNTER — Encounter: Admitting: Physician Assistant
